# Patient Record
Sex: Female | Born: 1975 | Race: White | Hispanic: No | Marital: Married | Smoking: Former smoker
Health system: Southern US, Community
[De-identification: ages and names within clinical notes are randomized; demographics above are authoritative.]

## PROBLEM LIST (undated history)

## (undated) DIAGNOSIS — R569 Unspecified convulsions: Secondary | ICD-10-CM

## (undated) DIAGNOSIS — I10 Essential (primary) hypertension: Secondary | ICD-10-CM

## (undated) DIAGNOSIS — E669 Obesity, unspecified: Secondary | ICD-10-CM

## (undated) DIAGNOSIS — F5104 Psychophysiologic insomnia: Secondary | ICD-10-CM

## (undated) DIAGNOSIS — R0681 Apnea, not elsewhere classified: Secondary | ICD-10-CM

## (undated) DIAGNOSIS — G40309 Generalized idiopathic epilepsy and epileptic syndromes, not intractable, without status epilepticus: Secondary | ICD-10-CM

## (undated) DIAGNOSIS — G4733 Obstructive sleep apnea (adult) (pediatric): Secondary | ICD-10-CM

## (undated) DIAGNOSIS — F419 Anxiety disorder, unspecified: Secondary | ICD-10-CM

## (undated) HISTORY — DX: Obstructive sleep apnea (adult) (pediatric): G47.33

## (undated) HISTORY — DX: Essential (primary) hypertension: I10

## (undated) HISTORY — DX: Generalized idiopathic epilepsy and epileptic syndromes, not intractable, without status epilepticus: G40.309

## (undated) HISTORY — DX: Psychophysiologic insomnia: F51.04

## (undated) HISTORY — DX: Unspecified convulsions: R56.9

## (undated) HISTORY — DX: Apnea, not elsewhere classified: R06.81

## (undated) HISTORY — PX: OVARIAN CYST REMOVAL: SHX89

## (undated) HISTORY — DX: Obesity, unspecified: E66.9

## (undated) HISTORY — DX: Anxiety disorder, unspecified: F41.9

## (undated) HISTORY — PX: OTHER SURGICAL HISTORY: SHX169

---

## 2003-08-06 ENCOUNTER — Other Ambulatory Visit: Admission: RE | Admit: 2003-08-06 | Discharge: 2003-08-06 | Payer: Self-pay | Admitting: Obstetrics and Gynecology

## 2003-08-20 ENCOUNTER — Encounter: Admission: RE | Admit: 2003-08-20 | Discharge: 2003-08-20 | Payer: Self-pay | Admitting: Obstetrics and Gynecology

## 2004-08-24 ENCOUNTER — Encounter: Admission: RE | Admit: 2004-08-24 | Discharge: 2004-08-24 | Payer: Self-pay | Admitting: Obstetrics and Gynecology

## 2004-09-02 ENCOUNTER — Other Ambulatory Visit: Admission: RE | Admit: 2004-09-02 | Discharge: 2004-09-02 | Payer: Self-pay | Admitting: Obstetrics and Gynecology

## 2005-11-22 ENCOUNTER — Encounter: Admission: RE | Admit: 2005-11-22 | Discharge: 2005-11-22 | Payer: Self-pay | Admitting: Obstetrics and Gynecology

## 2009-12-18 ENCOUNTER — Emergency Department (HOSPITAL_COMMUNITY): Admission: EM | Admit: 2009-12-18 | Discharge: 2009-12-19 | Payer: Self-pay | Admitting: Emergency Medicine

## 2010-06-13 ENCOUNTER — Encounter: Payer: Self-pay | Admitting: Obstetrics and Gynecology

## 2010-08-08 LAB — URINE CULTURE
Colony Count: NO GROWTH
Culture: NO GROWTH

## 2010-08-08 LAB — URINE MICROSCOPIC-ADD ON

## 2010-08-08 LAB — COMPREHENSIVE METABOLIC PANEL
Alkaline Phosphatase: 103 U/L (ref 39–117)
CO2: 17 mEq/L — ABNORMAL LOW (ref 19–32)
Calcium: 9.4 mg/dL (ref 8.4–10.5)
Creatinine, Ser: 1.01 mg/dL (ref 0.4–1.2)
GFR calc Af Amer: >60 mL/min (ref >60)
GFR calc non Af Amer: >60 mL/min (ref >60)
Sodium: 134 mEq/L — ABNORMAL LOW (ref 135–145)

## 2010-08-08 LAB — RAPID URINE DRUG SCREEN, HOSP PERFORMED
Opiates: NOT DETECTED
Tetrahydrocannabinol: NOT DETECTED

## 2010-08-08 LAB — DIFFERENTIAL
Basophils Absolute: 0.2 10*3/uL — ABNORMAL HIGH (ref 0.0–0.1)
Basophils Relative: 1 % (ref 0–1)
Eosinophils Relative: 0 % (ref 0–5)
Lymphs Abs: 3.8 10*3/uL (ref 0.7–4.0)
Monocytes Relative: 6 % (ref 3–12)
Neutrophils Relative %: 69 % (ref 43–77)

## 2010-08-08 LAB — POCT PREGNANCY, URINE: Preg Test, Ur: NEGATIVE

## 2010-08-08 LAB — URINALYSIS, ROUTINE W REFLEX MICROSCOPIC
Hgb urine dipstick: NEGATIVE
Ketones, ur: 15 mg/dL — AB
Nitrite: NEGATIVE
Protein, ur: 100 mg/dL — AB
pH: 5.5 (ref 5.0–8.0)

## 2010-08-08 LAB — ETHANOL: Alcohol, Ethyl (B): <5 mg/dL (ref 0–10)

## 2010-08-08 LAB — CBC
HCT: 39.6 % (ref 36.0–46.0)
MCV: 87.8 fL (ref 78.0–100.0)
WBC: 16.3 10*3/uL — ABNORMAL HIGH (ref 4.0–10.5)
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
## 2012-10-18 ENCOUNTER — Other Ambulatory Visit: Payer: Self-pay | Admitting: Neurology

## 2012-10-18 ENCOUNTER — Other Ambulatory Visit: Payer: Self-pay

## 2012-10-18 DIAGNOSIS — R569 Unspecified convulsions: Secondary | ICD-10-CM

## 2012-10-18 NOTE — Telephone Encounter (Signed)
Pt calling for refill on keppra 500mg  po bid.  # 60 with 3 refills.  I called and spoke to Oak[place], with [facility] and ok'd the refill. I spoke to pt and relayed that she needs to give [place] 3 days or refills.

## 2013-02-17 ENCOUNTER — Other Ambulatory Visit: Payer: Self-pay | Admitting: Neurology

## 2013-05-14 ENCOUNTER — Other Ambulatory Visit: Payer: Self-pay | Admitting: Neurology

## 2013-05-18 ENCOUNTER — Telehealth: Payer: Self-pay | Admitting: Neurology

## 2013-05-18 NOTE — Telephone Encounter (Signed)
Attempted to return patient's call. There was no answer and no VM to leave message.

## 2013-05-18 NOTE — Telephone Encounter (Signed)
Patient called wanting to schedule an appointment with Dr. Anne Hahn for a follow up since she hasn't been seen in awhile. 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 Please call the patient to schedule.

## 2013-05-21 ENCOUNTER — Telehealth: Payer: Self-pay | Admitting: *Deleted

## 2013-05-21 NOTE — Telephone Encounter (Signed)
Left message in regards to scheduling appt after 4:30. Dr. Anne Hahn' last appt slot 4:00 patient can call back to schedule.

## 2013-06-14 ENCOUNTER — Other Ambulatory Visit: Payer: Self-pay | Admitting: Neurology

## 2013-06-19 ENCOUNTER — Telehealth: Payer: Self-pay | Admitting: *Deleted

## 2013-06-19 NOTE — Telephone Encounter (Addendum)
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 This has been more noticeable the last month.    Wakes at night (2 x per night).  She had sleep consult and split study, then saw Dr. Vickey Hugerohmeier back and no f/u thereafter.  She was speaking very loudly about this (regarding f/u).  She is wondering if medications she is on may be causing this. I told her that she has been on these seizure meds for a long time.  Her med list (includes prilosec, folic, Bp med ?name, upset stomache/GI med, MVI, keppra, and lamictal.  She never had CPAP.  Her appt that was scheduled then canceled per pt back in 2/14 (pt stated not correct).  She has f/u with you 6/15 for seizures.  I think this more sleep related.  Please advise.   [phone] cell.  [place]

## 2013-06-19 NOTE — Telephone Encounter (Signed)
Received msg from Taylor Collier to contact the Taylor Collier regarding a follow up sleep study.  Pt is very disgruntled.  According to her she has made several attempts to call the office to follow up regarding a titration study.  Taylor Collier says that she was hung up on and left messages that were never returned.  She says that she was not aware that she was scheduled for a cpap titration study last year.  I advised the Taylor Collier that she herself made this appointment after she had her follow up appt with Dr. Vickey Hugerohmeier to review the results from the first sleep study.  I will send this notification to [facility] to ask for advisement regarding whether the Taylor Collier can go ahead and proceed with a titration study or will she need another appt with Dr. Vickey Hugerohmeier since it has been a year since her last visit.

## 2013-06-19 NOTE — Telephone Encounter (Signed)
Events noted. The patient was last seen on the on 02/11/2012. She will need a revisit with me as well. May see NP.

## 2013-06-20 ENCOUNTER — Telehealth: Payer: Self-pay | Admitting: *Deleted

## 2013-06-20 NOTE — Telephone Encounter (Signed)
Left msg for patient to return call to set up revisit with NP. She has 11/15/13 appt with Dr. Anne HahnWillis.

## 2013-06-20 NOTE — Telephone Encounter (Signed)
Spoke with Dr. Vickey Hugerohmeier about this patient.   If her insurance will allow her to return for titration with no office visit, she is fine having her come in to do the study.  We will have to check with her insurance.  There is no insurance coverage in Epic currently, her coverage during the time of her last sleep study was [place] BCBS with a secondary BCBS plan as well.  If she still has An[employer], to my knowledge she should be fine to proceed as long as we can produce the proper documentation (which we can from [employer]).  If it is Medicare/UHC/Aetna/etc., she will likely have to have a new face to face visit in order to get CPAP therapy covered.  Dr. Vickey Hugerohmeier is curious which number she called so many times because we have received no calls or messages from her in the sleep lab, perhaps she was calling a different lab?

## 2013-06-22 ENCOUNTER — Telehealth: Payer: Self-pay | Admitting: Neurology

## 2013-06-22 ENCOUNTER — Encounter: Payer: Self-pay | Admitting: *Deleted

## 2013-06-22 ENCOUNTER — Other Ambulatory Visit: Payer: Self-pay | Admitting: *Deleted

## 2013-06-22 DIAGNOSIS — G4733 Obstructive sleep apnea (adult) (pediatric): Secondary | ICD-10-CM

## 2013-06-22 NOTE — Telephone Encounter (Signed)
I called the patient to discuss her coverage information for the titration study.  Told patient that her primary insurance [employer] BCBS did not approve [phone] [id number], however she has secondary insurance [place] which typically would cover the coinsurance remaining from the primary at 100%.  BUT because Anthem denied the sleep study I could not guarantee her that it would automatically be covered by secondary insurance.  Pt began to yell and scream that she would not be paying us a $1000 and that we needed to find that information out.  Explained to her that I spoke with our billing department yesterday and it is impossible to determine whether they would pay or not until the claims have been processed.  [employer] BCBS recommends APAP.  Told patient I would send a notification to the physician about this order request.

## 2013-06-22 NOTE — Telephone Encounter (Signed)
Orders only encounter entered for this patient to start APAP 4-14 cm H2O.  [facility] -sh   She will need to schedule with Dr. Vickey Hugerohmeier within 30 days of using CPAP therapy.

## 2013-07-16 NOTE — Telephone Encounter (Signed)
Anne Sweetser [facility] Y[place] Deak[place], RPSGT            FYI we have not been able to reach patient for cpap setup. Left one message and no answer the last two times. No contact letter has been sent      Copied from Epic message to [facility] Cobie [facility]coux from [facility].

## 2013-09-07 ENCOUNTER — Telehealth: Payer: Self-pay | Admitting: *Deleted

## 2013-09-07 NOTE — Telephone Encounter (Signed)
Patient called and says to disregard message sent earlier today--DMV has given her an extension so she will keep her appt in June--thank you.

## 2013-10-15 ENCOUNTER — Other Ambulatory Visit: Payer: Self-pay | Admitting: Neurology

## 2013-10-16 ENCOUNTER — Telehealth: Payer: Self-pay | Admitting: *Deleted

## 2013-10-16 NOTE — Telephone Encounter (Signed)
Rx was last prescribed in 2013.  Would you like to refill?  Patient has an appt scheduled in June

## 2013-10-16 NOTE — Telephone Encounter (Signed)
We have not prescribed this medication since 2013.  Request was sent to provider, however, they did decline to fill it at this time.  I called the patient back on cell, got no answer.  Called home, she was not there.  Gentleman that answered the phone will let her know we called.

## 2013-11-16 ENCOUNTER — Ambulatory Visit (INDEPENDENT_AMBULATORY_CARE_PROVIDER_SITE_OTHER): Payer: BC Managed Care – PPO | Admitting: Neurology

## 2013-11-16 ENCOUNTER — Encounter (INDEPENDENT_AMBULATORY_CARE_PROVIDER_SITE_OTHER): Payer: Self-pay

## 2013-11-16 ENCOUNTER — Encounter: Payer: Self-pay | Admitting: Neurology

## 2013-11-16 VITALS — BP 135/96 | HR 83 | Wt 265.0 lb

## 2013-11-16 DIAGNOSIS — G40309 Generalized idiopathic epilepsy and epileptic syndromes, not intractable, without status epilepticus: Secondary | ICD-10-CM

## 2013-11-16 DIAGNOSIS — Z5181 Encounter for therapeutic drug level monitoring: Secondary | ICD-10-CM

## 2013-11-16 HISTORY — DX: Generalized idiopathic epilepsy and epileptic syndromes, not intractable, without status epilepticus: G40.309

## 2013-11-16 MED ORDER — LEVETIRACETAM 500 MG PO TABS: 500.0000 mg | ORAL_TABLET | Freq: Two times a day (BID) | ORAL | Status: DC

## 2013-11-16 MED ORDER — LAMOTRIGINE 200 MG PO TABS: 200.0000 mg | ORAL_TABLET | Freq: Two times a day (BID) | ORAL | Status: DC

## 2013-11-16 NOTE — Patient Instructions (Addendum)
Take melatonin 5 mg at night  Epilepsy Epilepsy is a disorder in which a person has repeated seizures over time. A seizure is a release of abnormal electrical activity in the brain. Seizures can cause a change in attention, behavior, or the ability to remain awake and alert (altered mental status). Seizures often involve uncontrollable shaking (convulsions).  Most people with epilepsy lead normal lives. However, people with epilepsy are at an increased risk of falls, accidents, and injuries. Therefore, it is important to begin treatment right away. CAUSES  Epilepsy has many possible causes. Anything that disturbs the normal pattern of brain cell activity can lead to seizures. This may include:   Head injury.  Birth trauma.  High fever as a child.  Stroke.  Bleeding into or around the brain.  Certain drugs.  Prolonged low oxygen, such as what occurs after CPR efforts.  Abnormal brain development.  Certain illnesses, such as meningitis, encephalitis (brain infection), malaria, and other infections.  An imbalance of nerve signaling chemicals (neurotransmitters).  SIGNS AND SYMPTOMS  The symptoms of a seizure can vary greatly from one person to another. Right before a seizure, you may have a warning (aura) that a seizure is about to occur. An aura may include the following symptoms:  Fear or anxiety.  Nausea.  Feeling like the room is spinning (vertigo).  Vision changes, such as seeing flashing lights or spots. Common symptoms during a seizure include:  Abnormal sensations, such as an abnormal smell or a bitter taste in the mouth.   Sudden, general body stiffness.   Convulsions that involve rhythmic jerking of the face, arm, or leg on one or both sides.   Sudden change in consciousness.   Appearing to be awake but not responding.   Appearing to be asleep but cannot be awakened.   Grimacing, chewing, lip smacking, drooling, tongue biting, or loss of bowel or  bladder control. After a seizure, you may feel sleepy for a while. DIAGNOSIS  Your health care provider will ask about your symptoms and take a medical history. Descriptions from any witnesses to your seizures will be very helpful in the diagnosis. A physical exam, including a detailed neurological exam, is necessary. Various tests may be done, such as:   An electroencephalogram (EEG). This is a painless test of your brain waves. In this test, a diagram is created of your brain waves. These diagrams can be interpreted by a specialist.  An MRI of the brain.   A CT scan of the brain.   A spinal tap (lumbar puncture, LP).  Blood tests to check for signs of infection or abnormal blood chemistry. TREATMENT  There is no cure for epilepsy, but it is generally treatable. Once epilepsy is diagnosed, it is important to begin treatment as soon as possible. For most people with epilepsy, seizures can be controlled with medicines. The following may also be used:  A pacemaker for the brain (vagus nerve stimulator) can be used for people with seizures that are not well controlled by medicine.  Surgery on the brain. For some people, epilepsy eventually goes away. HOME CARE INSTRUCTIONS   Follow your health care provider's recommendations on driving and safety in normal activities.  Get enough rest. Lack of sleep can cause seizures.  Only take over-the-counter or prescription medicines as directed by your health care provider. Take any prescribed medicine exactly as directed.  Avoid any known triggers of your seizures.  Keep a seizure diary. Record what you recall about any seizure,  especially any possible trigger.   Make sure the people you live and work with know that you are prone to seizures. They should receive instructions on how to help you. In general, a witness to a seizure should:   Cushion your head and body.   Turn you on your side.   Avoid unnecessarily restraining you.    Not place anything inside your mouth.   Call for emergency medical help if there is any question about what has occurred.   Follow up with your health care provider as directed. You may need regular blood tests to monitor the levels of your medicine.  SEEK MEDICAL CARE IF:   You develop signs of infection or other illness. This might increase the risk of a seizure.   You seem to be having more frequent seizures.   Your seizure pattern is changing.  SEEK IMMEDIATE MEDICAL CARE IF:   You have a seizure that does not stop after a few moments.   You have a seizure that causes any difficulty in breathing.   You have a seizure that results in a very severe headache.   You have a seizure that leaves you with the inability to speak or use a part of your body.  Document Released: 05/10/2005 Document Revised: 02/28/2013 Document Reviewed: 12/20/2012 [facility] Patient Information 2015 [employer] [place]. This information is not intended to replace advice given to you by your health care provider. Make sure you discuss any questions you have with your health care provider.
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
## 2013-11-19 DIAGNOSIS — Z0289 Encounter for other administrative examinations: Secondary | ICD-10-CM

## 2013-11-19 LAB — CBC WITH DIFFERENTIAL
BASOS ABS: 0 10*3/uL (ref 0.0–0.2)
Basos: 0 %
EOS: 0 %
Eosinophils Absolute: 0 10*3/uL (ref 0.0–0.4)
HEMATOCRIT: 33.9 % — AB (ref 34.0–46.6)
Hemoglobin: 11.7 g/dL (ref 11.1–15.9)
Immature Grans (Abs): 0 10*3/uL (ref 0.0–0.1)
Immature Granulocytes: 0 %
LYMPHS: 26 %
Lymphocytes Absolute: 2.1 10*3/uL (ref 0.7–3.1)
MCH: 27.5 pg (ref 26.6–33.0)
MCHC: 34.5 g/dL (ref 31.5–35.7)
MCV: 80 fL (ref 79–97)
Monocytes Absolute: 0.5 10*3/uL (ref 0.1–0.9)
Monocytes: 7 %
NEUTROS ABS: 5.2 10*3/uL (ref 1.4–7.0)
Neutrophils Relative %: 67 %
PLATELETS: 336 10*3/uL (ref 150–379)
RBC: 4.25 x10E6/uL (ref 3.77–5.28)
RDW: 14.7 % (ref 12.3–15.4)
WBC: 7.8 10*3/uL (ref 3.4–10.8)

## 2013-11-19 LAB — COMPREHENSIVE METABOLIC PANEL
A/G RATIO: 2.2 (ref 1.1–2.5)
ALBUMIN: 4.3 g/dL (ref 3.5–5.5)
ALT: 26 IU/L (ref 0–32)
AST: 21 IU/L (ref 0–40)
Alkaline Phosphatase: 94 IU/L (ref 39–117)
BUN/Creatinine Ratio: 15 (ref 9–23)
BUN: 11 mg/dL (ref 6–24)
CO2: 21 mmol/L (ref 18–29)
Calcium: 9.4 mg/dL (ref 8.7–10.2)
Chloride: 100 mmol/L (ref 97–108)
Creatinine, Ser: 0.72 mg/dL (ref 0.57–1.00)
GFR, EST AFRICAN AMERICAN: 121 mL/min/{1.73_m2} (ref >59)
GFR, EST NON AFRICAN AMERICAN: 105 mL/min/{1.73_m2} (ref >59)
GLOBULIN, TOTAL: 2 g/dL (ref 1.5–4.5)
GLUCOSE: 80 mg/dL (ref 65–99)
Potassium: 4.4 mmol/L (ref 3.5–5.2)
SODIUM: 139 mmol/L (ref 134–144)
TOTAL PROTEIN: 6.3 g/dL (ref 6.0–8.5)
Total Bilirubin: 0.3 mg/dL (ref 0.0–1.2)

## 2013-11-19 LAB — LAMOTRIGINE LEVEL: LAMOTRIGINE LVL: 6.6 ug/mL (ref 2.0–20.0)

## 2013-11-19 NOTE — Progress Notes (Signed)
Quick Note:  Shared unremarkable results with patient, she verbalized understanding ______ 

## 2013-12-14 ENCOUNTER — Other Ambulatory Visit: Payer: Self-pay | Admitting: Neurology

## 2013-12-26 NOTE — Telephone Encounter (Signed)
Noted  

## 2014-06-03 ENCOUNTER — Other Ambulatory Visit: Payer: Self-pay | Admitting: Neurology

## 2014-11-19 ENCOUNTER — Ambulatory Visit: Payer: BC Managed Care – PPO | Admitting: Neurology

## 2014-12-02 ENCOUNTER — Other Ambulatory Visit: Payer: Self-pay | Admitting: Neurology

## 2014-12-02 NOTE — Telephone Encounter (Signed)
Has appt scheduled.

## 2014-12-24 ENCOUNTER — Encounter: Payer: Self-pay | Admitting: Neurology

## 2014-12-24 ENCOUNTER — Ambulatory Visit (INDEPENDENT_AMBULATORY_CARE_PROVIDER_SITE_OTHER): Payer: BC Managed Care – PPO | Admitting: Neurology

## 2014-12-24 VITALS — BP 157/93 | HR 85 | Ht 64.0 in | Wt 269.4 lb

## 2014-12-24 DIAGNOSIS — F5104 Psychophysiologic insomnia: Secondary | ICD-10-CM | POA: Insufficient documentation

## 2014-12-24 DIAGNOSIS — G40309 Generalized idiopathic epilepsy and epileptic syndromes, not intractable, without status epilepticus: Secondary | ICD-10-CM

## 2014-12-24 DIAGNOSIS — Z5181 Encounter for therapeutic drug level monitoring: Secondary | ICD-10-CM | POA: Diagnosis not present

## 2014-12-24 DIAGNOSIS — G4700 Insomnia, unspecified: Secondary | ICD-10-CM | POA: Diagnosis not present

## 2014-12-24 HISTORY — DX: Psychophysiologic insomnia: F51.04

## 2014-12-24 MED ORDER — LAMOTRIGINE 200 MG PO TABS: 200.0000 mg | ORAL_TABLET | Freq: Two times a day (BID) | ORAL | Status: DC

## 2014-12-24 MED ORDER — TRAZODONE HCL 50 MG PO TABS: 50.0000 mg | ORAL_TABLET | Freq: Every day | ORAL | Status: DC

## 2014-12-24 MED ORDER — LEVETIRACETAM 500 MG PO TABS: 500.0000 mg | ORAL_TABLET | Freq: Two times a day (BID) | ORAL | Status: DC

## 2014-12-24 NOTE — Progress Notes (Signed)
Reason for visit: Seizures  Taylor Collier is an 39 y.o. female  History of present illness:  Taylor Collier is a 39 year old right-handed white female with a history of obesity and a history of seizures. She also reports chronic issues with insomnia. She will be sleepy during the day, and she may take a 2 or 3 hour nap. In the evening, she has racing thoughts, and she is unable to sleep. She denies any seizures since last seen, the last seizure was in January 2013. The patient does operate a motor vehicle, and she is doing well with this. She works as a Runner, broadcasting/film/video. The patient is on Keppra and Lamictal, she is concerned that the Keppra is making her drowsy during the daytime. She has taken 10 mg of melatonin at night without benefit. She returns to this office for an evaluation.  Past Medical History  Diagnosis Date  . Seizures   . Hypertension   . Anxiety   . Apnea     witnessed with snoring  . OSA (obstructive sleep apnea)   . 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 Obesity   . Chronic insomnia 12/24/2014    Past Surgical History  Procedure Laterality Date  . Fallopian tube blockage resection    . Ovarian cyst removal    . Birthmark resection      Family History  Problem Relation Age of Onset  . Colon cancer Mother   . Aneurysm Father   . Breast cancer Sister   . Diabetes Paternal Aunt   . Alzheimer's disease Maternal Grandmother     Social history:  reports that she has quit smoking. Her smoking use included Cigarettes. She has never used smokeless tobacco. She reports that she does not drink alcohol or use illicit drugs.    Allergies  Allergen Reactions  . Penicillins Rash    Medications:  Prior to Admission medications   Medication Sig Start Date End Date Taking? Authorizing Provider  acetaminophen (TYLENOL) 500 MG tablet Take 1,000 mg by mouth daily.   Yes Historical Provider, MD  cetirizine (ZYRTEC) 10 MG tablet Take 10  mg by mouth daily.   Yes Historical Provider, MD  folic acid (FOLVITE) 1 MG tablet TAKE 1 TABLET BY MOUTH TWICE DAILY 12/14/13  Yes York Spaniel, MD  hyoscyamine (LEVSIN, ANASPAZ) 0.125 MG tablet Take 0.125 mg by mouth 2 (two) times daily.  11/13/13  Yes Historical Provider, MD  lamoTRIgine (LAMICTAL) 200 MG tablet TAKE 1 TABLET BY MOUTH TWICE DAILY 12/02/14  Yes York Spaniel, MD  levETIRAcetam (KEPPRA) 500 MG tablet TAKE 1 TABLET BY MOUTH TWICE DAILY 12/02/14  Yes York Spaniel, MD  Melatonin 10 MG TABS Take 1 tablet by mouth at bedtime.   Yes Historical Provider, MD  methyldopa (ALDOMET) 500 MG tablet Take 500 mg by mouth 3 (three) times daily.  11/13/13  Yes Historical Provider, MD  Multiple Vitamin (MULTIVITAMIN) tablet Take 1 tablet by mouth daily.   Yes Historical Provider, MD  omeprazole (PRILOSEC) 20 MG capsule Take 20 mg by mouth daily. 11/12/13  Yes Historical Provider, MD    ROS:  Out of a complete 14 system review of symptoms, the patient complains only of the following symptoms, and all other reviewed systems are negative.  Excessive thirst Daytime sleepiness, snoring Headache   Blood pressure 157/93, pulse 85, height  (1.626 m), weight 269 lb 6.4 oz (122.199 kg).  Physical Exam  General: The patient  is alert and cooperative at the time of the examination. The patient is markedly obese.  Skin: No significant peripheral edema is noted.   Neurologic Exam  Mental status: The patient is alert and oriented x 3 at the time of the examination. The patient has apparent normal recent and remote memory, with an apparently normal attention span and concentration ability.   Cranial nerves: Facial symmetry is present. Speech is normal, no aphasia or dysarthria is noted. Extraocular movements are full. Visual fields are full.  Motor: The patient has good strength in all 4 extremities.  Sensory examination: Soft touch sensation is symmetric on the face, arms, and  legs.  Coordination: The patient has good finger-nose-finger and heel-to-shin bilaterally.  Gait and station: The patient has a normal gait. Tandem gait is normal. Romberg is negative. No drift is seen.  Reflexes: Deep tendon reflexes are symmetric.   Assessment/Plan:  1. History of seizures, well controlled  2. Chronic insomnia  3. Obesity  4. Obstructive sleep apnea  The patient will continue her Lamictal and Keppra, and the future we may consider switching to the extended release Keppra. The patient will have blood work done today. She will be given a trial on trazodone for the insomnia to see if this is helpful. She will follow-up in one year, sooner if needed.  Marlan Palau MD 12/24/2014 7:28 PM  [facility] [address] [place], [place] [address]  Phone [phone] Fax [phone]

## 2014-12-24 NOTE — Patient Instructions (Addendum)
   We will continue Keppra and Lamictal for now, if you desire to have a long-acting preparation of Keppra, but me know. We will try trazodone at night for sleep, you may start at a 50 mg dose, but if this is not enough, you may go to 100 mg or 150 mg at night. We will follow-up in one year, or sooner if needed.   Seizure, Adult A seizure is abnormal electrical activity in the brain. Seizures usually last from 30 seconds to 2 minutes. There are various types of seizures. Before a seizure, you may have a warning sensation (aura) that a seizure is about to occur. An aura may include the following symptoms:   Fear or anxiety.  Nausea.  Feeling like the room is spinning (vertigo).  Vision changes, such as seeing flashing lights or spots. Common symptoms during a seizure include:  A change in attention or behavior (altered mental status).  Convulsions with rhythmic jerking movements.  Drooling.  Rapid eye movements.  Grunting.  Loss of bladder and bowel control.  Bitter taste in the mouth.  Tongue biting. After a seizure, you may feel confused and sleepy. You may also have an injury resulting from convulsions during the seizure. HOME CARE INSTRUCTIONS   If you are given medicines, take them exactly as prescribed by your health care provider.  Keep all follow-up appointments as directed by your health care provider.  Do not swim or drive or engage in risky activity during which a seizure could cause further injury to you or others until your health care provider says it is OK.  Get adequate rest.  Teach friends and family what to do if you have a seizure. They should:  Lay you on the ground to prevent a fall.  Put a cushion under your head.  Loosen any tight clothing around your neck.  Turn you on your side. If vomiting occurs, this helps keep your airway clear.  Stay with you until you recover.  Know whether or not you need emergency care. SEEK IMMEDIATE MEDICAL  CARE IF:  The seizure lasts longer than 5 minutes.  The seizure is severe or you do not wake up immediately after the seizure.  You have an altered mental status after the seizure.  You are having more frequent or worsening seizures. Someone should drive you to the emergency department or call local emergency services (911 in [place] MAKE SURE YOU:  Understand these instructions.  Will watch your condition.  Will get help right away if you are not doing well or get worse. Document Released: 05/07/2000 Document Revised: 02/28/2013 Document Reviewed: 12/20/2012 [facility] Patient Information 2015 [place], [place]. This information is not intended to replace advice given to you by your health care provider. Make sure you discuss any questions you have with your health care provider.

## 2014-12-25 LAB — CBC WITH DIFFERENTIAL/PLATELET
BASOS ABS: 0 10*3/uL (ref 0.0–0.2)
Basos: 0 %
EOS (ABSOLUTE): 0 10*3/uL (ref 0.0–0.4)
EOS: 0 %
HEMOGLOBIN: 11.1 g/dL (ref 11.1–15.9)
Hematocrit: 33.9 % — ABNORMAL LOW (ref 34.0–46.6)
Immature Grans (Abs): 0 10*3/uL (ref 0.0–0.1)
Immature Granulocytes: 0 %
Lymphocytes Absolute: 2.1 10*3/uL (ref 0.7–3.1)
Lymphs: 30 %
MCH: 26.7 pg (ref 26.6–33.0)
MCHC: 32.7 g/dL (ref 31.5–35.7)
MCV: 82 fL (ref 79–97)
MONOS ABS: 0.5 10*3/uL (ref 0.1–0.9)
Monocytes: 7 %
Neutrophils Absolute: 4.3 10*3/uL (ref 1.4–7.0)
Neutrophils: 63 %
Platelets: 353 10*3/uL (ref 150–379)
RBC: 4.15 x10E6/uL (ref 3.77–5.28)
RDW: 16.4 % — AB (ref 12.3–15.4)
WBC: 6.8 10*3/uL (ref 3.4–10.8)

## 2014-12-25 LAB — COMPREHENSIVE METABOLIC PANEL
A/G RATIO: 1.9 (ref 1.1–2.5)
ALK PHOS: 96 IU/L (ref 39–117)
ALT: 27 IU/L (ref 0–32)
AST: 23 IU/L (ref 0–40)
Albumin: 4.5 g/dL (ref 3.5–5.5)
BUN/Creatinine Ratio: 14 (ref 9–23)
BUN: 9 mg/dL (ref 6–24)
Bilirubin Total: 0.4 mg/dL (ref 0.0–1.2)
CO2: 21 mmol/L (ref 18–29)
CREATININE: 0.64 mg/dL (ref 0.57–1.00)
Calcium: 9.4 mg/dL (ref 8.7–10.2)
Chloride: 99 mmol/L (ref 97–108)
GFR calc Af Amer: 128 mL/min/{1.73_m2} (ref >59)
GFR, EST NON AFRICAN AMERICAN: 111 mL/min/{1.73_m2} (ref >59)
GLOBULIN, TOTAL: 2.4 g/dL (ref 1.5–4.5)
GLUCOSE: 80 mg/dL (ref 65–99)
Potassium: 4.4 mmol/L (ref 3.5–5.2)
SODIUM: 138 mmol/L (ref 134–144)
Total Protein: 6.9 g/dL (ref 6.0–8.5)

## 2014-12-25 LAB — LAMOTRIGINE LEVEL: LAMOTRIGINE LVL: 8.3 ug/mL (ref 2.0–20.0)

## 2014-12-26 ENCOUNTER — Telehealth: Payer: Self-pay

## 2014-12-26 NOTE — Telephone Encounter (Signed)
-----   Message from York Spaniel, MD sent at 12/25/2014  6:20 PM EDT -----  The blood work results are unremarkable. Good lamotrigine level, no change in dosing. Please call the patient. ----- Message -----    From: [facility] Lab Results In Interface    Sent: 12/25/2014   7:43 AM      To: York Spaniel, MD

## 2014-12-26 NOTE — Telephone Encounter (Signed)
I called the patient and relayed results. 

## 2014-12-30 ENCOUNTER — Other Ambulatory Visit: Payer: Self-pay | Admitting: Neurology

## 2015-01-02 ENCOUNTER — Other Ambulatory Visit: Payer: Self-pay | Admitting: Neurology

## 2015-02-27 ENCOUNTER — Other Ambulatory Visit: Payer: Self-pay | Admitting: Neurology

## 2015-04-16 ENCOUNTER — Telehealth: Payer: Self-pay | Admitting: Neurology

## 2015-04-16 NOTE — Telephone Encounter (Signed)
Pt  Called to see if lab orders could be sent to get her levels for her meds could be checked. She said she sees him once a year but half way through the year she likes to get her levels checked but was hoping it could be sent to the [facility] in [place].  She can be reached at [phone].

## 2015-04-16 NOTE — Telephone Encounter (Signed)
I called the patient. She stated she would like to have her lamotrigine level checked. She would like to know if Dr. Anne HahnWillis will put in an order for this. She states that she gets dizzy at times and has a slight headache at times (not unbearable)... She just wants to make sure her levels are still okay. I advised that we would call back Monday as Dr. Anne HahnWillis is out of the office until then.

## 2015-04-16 NOTE — Telephone Encounter (Signed)
I called the patient. She wants blood levels checked, she is having drowsiness and headache. I will check keppra and lamotrigine levels. Will need to fax order to [facility].

## 2015-04-23 NOTE — Telephone Encounter (Signed)
Order re-written and faxed to number provided.

## 2015-04-23 NOTE — Telephone Encounter (Signed)
Patient called to advise she is currently at [facility] in [place] to have labs drawn but was advised order we faxed is not legible, order needs to be re-faxed to [phone].

## 2015-04-28 ENCOUNTER — Other Ambulatory Visit: Payer: Self-pay | Admitting: Neurology

## 2015-04-30 ENCOUNTER — Telehealth: Payer: Self-pay | Admitting: Neurology

## 2015-04-30 NOTE — Telephone Encounter (Signed)
Level of Keppra done at [facility] was 7.9 on November 30.

## 2015-05-27 ENCOUNTER — Encounter: Payer: Self-pay | Admitting: Neurology

## 2015-06-27 ENCOUNTER — Other Ambulatory Visit: Payer: Self-pay | Admitting: Neurology

## 2015-08-26 ENCOUNTER — Other Ambulatory Visit: Payer: Self-pay | Admitting: Neurology

## 2015-10-30 ENCOUNTER — Telehealth: Payer: Self-pay | Admitting: Neurology

## 2015-10-30 NOTE — Telephone Encounter (Signed)
Medical Records faxed to [facility] (p) [phone]

## 2015-12-15 ENCOUNTER — Encounter: Payer: Self-pay | Admitting: Cardiology

## 2015-12-24 ENCOUNTER — Encounter: Payer: Self-pay | Admitting: Neurology

## 2015-12-24 ENCOUNTER — Ambulatory Visit (INDEPENDENT_AMBULATORY_CARE_PROVIDER_SITE_OTHER): Payer: BC Managed Care – PPO | Admitting: Neurology

## 2015-12-24 VITALS — BP 147/97 | HR 83 | Ht 64.0 in | Wt 271.0 lb

## 2015-12-24 DIAGNOSIS — G40309 Generalized idiopathic epilepsy and epileptic syndromes, not intractable, without status epilepticus: Secondary | ICD-10-CM

## 2015-12-24 DIAGNOSIS — F5104 Psychophysiologic insomnia: Secondary | ICD-10-CM

## 2015-12-24 DIAGNOSIS — Z5181 Encounter for therapeutic drug level monitoring: Secondary | ICD-10-CM | POA: Diagnosis not present

## 2015-12-24 DIAGNOSIS — G4700 Insomnia, unspecified: Secondary | ICD-10-CM

## 2015-12-24 MED ORDER — TRAZODONE HCL 50 MG PO TABS: 100.0000 mg | ORAL_TABLET | Freq: Every day | ORAL | 5 refills | Status: DC

## 2015-12-24 MED ORDER — LAMOTRIGINE 200 MG PO TABS: 200.0000 mg | ORAL_TABLET | Freq: Two times a day (BID) | ORAL | 3 refills | Status: DC

## 2015-12-24 MED ORDER — LEVETIRACETAM 500 MG PO TABS: 500.0000 mg | ORAL_TABLET | Freq: Two times a day (BID) | ORAL | 3 refills | Status: DC

## 2015-12-24 NOTE — Patient Instructions (Addendum)
Insomnia Insomnia is a sleep disorder that makes it difficult to fall asleep or to stay asleep. Insomnia can cause tiredness (fatigue), low energy, difficulty concentrating, mood swings, and poor performance at work or school.  There are three different ways to classify insomnia:  Difficulty falling asleep.  Difficulty staying asleep.  Waking up too early in the morning. Any type of insomnia can be long-term (chronic) or short-term (acute). Both are common. Short-term insomnia usually lasts for three months or less. Chronic insomnia occurs at least three times a week for longer than three months. CAUSES  Insomnia may be caused by another condition, situation, or substance, such as:  Anxiety.  Certain medicines.  Gastroesophageal reflux disease (GERD) or other gastrointestinal conditions.  Asthma or other breathing conditions.  Restless legs syndrome, sleep apnea, or other sleep disorders.  Chronic pain.  Menopause. This may include hot flashes.  Stroke.  Abuse of alcohol, tobacco, or illegal drugs.  Depression.  Caffeine.   Neurological disorders, such as Alzheimer disease.  An overactive thyroid (hyperthyroidism). The cause of insomnia may not be known. RISK FACTORS Risk factors for insomnia include:  Gender. Women are more commonly affected than men.  Age. Insomnia is more common as you get older.  Stress. This may involve your professional or personal life.  Income. Insomnia is more common in people with lower income.  Lack of exercise.   Irregular work schedule or night shifts.  Traveling between different time zones. SIGNS AND SYMPTOMS If you have insomnia, trouble falling asleep or trouble staying asleep is the main symptom. This may lead to other symptoms, such as:  Feeling fatigued.  Feeling nervous about going to sleep.  Not feeling rested in the morning.  Having trouble concentrating.  Feeling irritable, anxious, or depressed. TREATMENT   Treatment for insomnia depends on the cause. If your insomnia is caused by an underlying condition, treatment will focus on addressing the condition. Treatment may also include:   Medicines to help you sleep.  Counseling or therapy.  Lifestyle adjustments. HOME CARE INSTRUCTIONS   Take medicines only as directed by your health care provider.  Keep regular sleeping and waking hours. Avoid naps.  Keep a sleep diary to help you and your health care provider figure out what could be causing your insomnia. Include:   When you sleep.  When you wake up during the night.  How well you sleep.   How rested you feel the next day.  Any side effects of medicines you are taking.  What you eat and drink.   Make your bedroom a comfortable place where it is easy to fall asleep:  Put up shades or special blackout curtains to block light from outside.  Use a white noise machine to block noise.  Keep the temperature cool.   Exercise regularly as directed by your health care provider. Avoid exercising right before bedtime.  Use relaxation techniques to manage stress. Ask your health care provider to suggest some techniques that may work well for you. These may include:  Breathing exercises.  Routines to release muscle tension.  Visualizing peaceful scenes.  Cut back on alcohol, caffeinated beverages, and cigarettes, especially close to bedtime. These can disrupt your sleep.  Do not overeat or eat spicy foods right before bedtime. This can lead to digestive discomfort that can make it hard for you to sleep.  Limit screen use before bedtime. This includes:  Watching TV.  Using your smartphone, tablet, and computer.  Stick to a routine. This   can help you fall asleep faster. Try to do a quiet activity, brush your teeth, and go to bed at the same time each night.  Get out of bed if you are still awake after 15 minutes of trying to sleep. Keep the lights down, but try reading or  doing a quiet activity. When you feel sleepy, go back to bed.  Make sure that you drive carefully. Avoid driving if you feel very sleepy.  Keep all follow-up appointments as directed by your health care provider. This is important. SEEK MEDICAL CARE IF:   You are tired throughout the day or have trouble in your daily routine due to sleepiness.  You continue to have sleep problems or your sleep problems get worse. SEEK IMMEDIATE MEDICAL CARE IF:   You have serious thoughts about hurting yourself or someone else.   This information is not intended to replace advice given to you by your health care provider. Make sure you discuss any questions you have with your health care provider.   Document Released: 05/07/2000 Document Revised: 01/29/2015 Document Reviewed: 02/08/2014 Elsevier Interactive Patient Education 2016 [employer] Inc.  

## 2015-12-24 NOTE — Progress Notes (Signed)
Reason for visit: Seizures  Taylor Collier is an 40 y.o. female  History of present illness:  Taylor Collier is a 40 year old right-handed white female with a history of seizures that have been under good control. The last seizure was in January 2013. The patient works as a Runner, broadcasting/film/video, she operates a Librarian, academic. The patient has had chronic issues with insomnia. The patient has trouble getting to sleep, and she has excessive daytime drowsiness during the day. She is not very active physically during the day. She has had issues with hypertension, she has had some recent adjustments with her blood pressure medications. The patient is on Lamictal and Keppra, these medications have been well tolerated. She takes trazodone at night, but only 50 mg. She returns for an evaluation.  Past Medical History:  Diagnosis Date  . Anxiety   . Apnea    witnessed with snoring  . Chronic insomnia 12/24/2014  . 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 Hypertension   . Obesity   . OSA (obstructive sleep apnea)   . Seizures (HCC)     Past Surgical History:  Procedure Laterality Date  . birthmark resection    . fallopian tube blockage resection    . OVARIAN CYST REMOVAL      Family History  Problem Relation Age of Onset  . Colon cancer Mother   . Aneurysm Father   . Breast cancer Sister   . Diabetes Paternal Aunt   . Alzheimer's disease Maternal Grandmother     Social history:  reports that she has quit smoking. Her smoking use included Cigarettes. She has never used smokeless tobacco. She reports that she does not drink alcohol or use drugs.    Allergies  Allergen Reactions  . Amoxicillin-Pot Clavulanate   . Penicillins Rash    Medications:  Prior to Admission medications   Medication Sig Start Date End Date Taking? Authorizing Provider  acetaminophen (TYLENOL) 500 MG tablet Take 1,000 mg by mouth daily.   Yes Historical Provider, MD  cetirizine  (ZYRTEC) 10 MG tablet Take 10 mg by mouth daily.   Yes Historical Provider, MD  folic acid (FOLVITE) 1 MG tablet TAKE 1 TABLET BY MOUTH TWICE DAILY 12/31/14  Yes Taylor Spaniel, MD  hydrochlorothiazide (HYDRODIURIL) 25 MG tablet Take 25 mg by mouth.   Yes Historical Provider, MD  labetalol (NORMODYNE) 100 MG tablet Take 100 mg by mouth 2 (two) times daily. 11/10/15  Yes Historical Provider, MD  lamoTRIgine (LAMICTAL) 200 MG tablet Take 1 tablet (200 mg total) by mouth 2 (two) times daily. 12/24/15  Yes Taylor Spaniel, MD  levETIRAcetam (KEPPRA) 500 MG tablet Take 1 tablet (500 mg total) by mouth 2 (two) times daily. 12/24/15  Yes Taylor Spaniel, MD  Multiple Vitamin (MULTIVITAMIN) tablet Take 1 tablet by mouth daily.   Yes Historical Provider, MD  traZODone (DESYREL) 50 MG tablet Take 2 tablets (100 mg total) by mouth at bedtime. 12/24/15  Yes Taylor Spaniel, MD    ROS:  Out of a complete 14 system review of symptoms, the patient complains only of the following symptoms, and all other reviewed systems are negative.  Frequent waking, daytime sleepiness  Blood pressure (!) 147/97, pulse 83, height 5\' 4"  (1.626 m), weight 271 lb (122.9 kg).  Physical Exam  General: The patient is alert and cooperative at the time of the examination. The patient is markedly obese.  Skin: No significant peripheral edema is noted.  Neurologic Exam  Mental status: The patient is alert and oriented x 3 at the time of the examination. The patient has apparent normal recent and remote memory, with an apparently normal attention span and concentration ability.   Cranial nerves: Facial symmetry is present. Speech is normal, no aphasia or dysarthria is noted. Extraocular movements are full. Visual fields are full.  Motor: The patient has good strength in all 4 extremities.  Sensory examination: Soft touch sensation is symmetric on the face, arms, and legs.  Coordination: The patient has good finger-nose-finger  and heel-to-shin bilaterally.  Gait and station: The patient has a normal gait. Tandem gait is slightly unsteady. Romberg is negative. No drift is seen.  Reflexes: Deep tendon reflexes are symmetric.   Assessment/Plan:  1. History seizures, well controlled  2. Chronic insomnia  The patient will go up on the trazodone taking 100 mg at night, a prescription was called in. She will have blood work done today, a prescription for the Keppra and Lamictal were called in today. The patient will follow-up in one year, sooner if needed. The patient is to contact our office if the insomnia remains an issue. I urged her to increase her physical activity to improve her blood pressure and sleeping habits.   Marlan Palau MD 12/24/2015 9:33 AM  [facility] [address] [place], [place] [address]  Phone [phone] Fax [phone]

## 2015-12-26 LAB — COMPREHENSIVE METABOLIC PANEL
A/G RATIO: 1.7 (ref 1.2–2.2)
ALBUMIN: 4.3 g/dL (ref 3.5–5.5)
ALT: 43 IU/L — AB (ref 0–32)
AST: 29 IU/L (ref 0–40)
Alkaline Phosphatase: 101 IU/L (ref 39–117)
BUN/Creatinine Ratio: 13 (ref 9–23)
BUN: 11 mg/dL (ref 6–24)
Bilirubin Total: 0.5 mg/dL (ref 0.0–1.2)
CALCIUM: 9.7 mg/dL (ref 8.7–10.2)
CO2: 22 mmol/L (ref 18–29)
CREATININE: 0.82 mg/dL (ref 0.57–1.00)
Chloride: 99 mmol/L (ref 96–106)
GFR calc Af Amer: 102 mL/min/{1.73_m2} (ref >59)
GFR, EST NON AFRICAN AMERICAN: 89 mL/min/{1.73_m2} (ref >59)
Globulin, Total: 2.6 g/dL (ref 1.5–4.5)
Glucose: 104 mg/dL — ABNORMAL HIGH (ref 65–99)
POTASSIUM: 4 mmol/L (ref 3.5–5.2)
Sodium: 141 mmol/L (ref 134–144)
TOTAL PROTEIN: 6.9 g/dL (ref 6.0–8.5)

## 2015-12-26 LAB — LEVETIRACETAM LEVEL: Levetiracetam Lvl: 22.2 ug/mL (ref 10.0–40.0)

## 2015-12-26 LAB — CBC WITH DIFFERENTIAL/PLATELET
BASOS ABS: 0 10*3/uL (ref 0.0–0.2)
BASOS: 1 %
EOS (ABSOLUTE): 0.1 10*3/uL (ref 0.0–0.4)
Eos: 1 %
Hematocrit: 38.3 % (ref 34.0–46.6)
Hemoglobin: 12.6 g/dL (ref 11.1–15.9)
IMMATURE GRANS (ABS): 0 10*3/uL (ref 0.0–0.1)
IMMATURE GRANULOCYTES: 0 %
LYMPHS: 21 %
Lymphocytes Absolute: 1.8 10*3/uL (ref 0.7–3.1)
MCH: 28.7 pg (ref 26.6–33.0)
MCHC: 32.9 g/dL (ref 31.5–35.7)
MCV: 87 fL (ref 79–97)
Monocytes Absolute: 0.6 10*3/uL (ref 0.1–0.9)
Monocytes: 7 %
NEUTROS PCT: 70 %
Neutrophils Absolute: 6 10*3/uL (ref 1.4–7.0)
PLATELETS: 332 10*3/uL (ref 150–379)
RBC: 4.39 x10E6/uL (ref 3.77–5.28)
RDW: 14.5 % (ref 12.3–15.4)
WBC: 8.6 10*3/uL (ref 3.4–10.8)

## 2015-12-26 LAB — LAMOTRIGINE LEVEL: LAMOTRIGINE LVL: 4.9 ug/mL (ref 2.0–20.0)

## 2015-12-29 ENCOUNTER — Telehealth: Payer: Self-pay

## 2015-12-29 NOTE — Telephone Encounter (Signed)
Called pt w/ unremarkable lab results. Let her know that Keppra and Lamictal levels are w/in therapuetic range. Pt voiced concern re: Lamictal level being at the low end of normal. Says that she had a seizure when that happened previously and had to add Keppra. Explained that addition of Keppra was to help w/ prevention of  breakthrough seizures. Verbalized understanding and appreciation for call.

## 2015-12-29 NOTE — Telephone Encounter (Signed)
-----   Message from York Spanielharles K Willis, MD sent at 12/26/2015  1:14 PM EDT ----- The blood work results are unremarkable.  Keppra and Lamictal levels are therapeutic. No change in dosing. Please call the patient. ----- Message ----- From: Nell RangeInterface, [facility] Lab Results In Sent: 12/25/2015   7:42 AM To: York Spanielharles K Willis, MD

## 2016-01-09 ENCOUNTER — Encounter: Payer: Self-pay | Admitting: *Deleted

## 2016-01-12 ENCOUNTER — Ambulatory Visit (INDEPENDENT_AMBULATORY_CARE_PROVIDER_SITE_OTHER): Payer: BC Managed Care – PPO | Admitting: Cardiology

## 2016-01-12 ENCOUNTER — Encounter: Payer: Self-pay | Admitting: Cardiology

## 2016-01-12 VITALS — BP 121/89 | HR 81 | Ht 64.0 in | Wt 276.0 lb

## 2016-01-12 DIAGNOSIS — R011 Cardiac murmur, unspecified: Secondary | ICD-10-CM | POA: Diagnosis not present

## 2016-01-12 DIAGNOSIS — I10 Essential (primary) hypertension: Secondary | ICD-10-CM

## 2016-01-12 MED ORDER — CHLORTHALIDONE 25 MG PO TABS: 25.0000 mg | ORAL_TABLET | Freq: Every day | ORAL | 6 refills | Status: DC

## 2016-01-12 MED ORDER — LABETALOL HCL 200 MG PO TABS: 200.0000 mg | ORAL_TABLET | Freq: Two times a day (BID) | ORAL | 6 refills | Status: DC

## 2016-01-12 NOTE — Progress Notes (Addendum)
Clinical Summary [name] is a 40 y.o.female seen today as a new patient, she is referred by Dr Argie RammingBurdnine.    1. HTN - notes high bp's, 220s/140s. Se[facility] in early June with high bp and headaches.  - has had adjustments in bp med over time. Recently started on HCTZ 25 mg and losartan 50mg .  - compliant with meds.  - she denies any chest pain, no SOB or DOE - home bp's remain elevated at times.   2. OSA screen - +snoring episodes, no apneic epsidoes, + daytime somnolence. Has had sleep studies several years ago she reports that was not significantly abnormal.     Past Medical History:  Diagnosis Date  . Anxiety   . Apnea    witnessed with snoring  . Chronic insomnia 12/24/2014  . 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 Hypertension   . Obesity   . OSA (obstructive sleep apnea)   . Seizures (HCC)      Allergies  Allergen Reactions  . Amoxicillin-Pot Clavulanate   . Penicillins Rash     Current Outpatient Prescriptions  Medication Sig Dispense Refill  . acetaminophen (TYLENOL) 500 MG tablet Take 1,000 mg by mouth daily.    . cetirizine (ZYRTEC) 10 MG tablet Take 10 mg by mouth daily.    . folic acid (FOLVITE) 1 MG tablet TAKE 1 TABLET BY MOUTH TWICE DAILY 60 tablet 12  . hydrochlorothiazide (HYDRODIURIL) 25 MG tablet Take 25 mg by mouth.    . labetalol (NORMODYNE) 100 MG tablet Take 100 mg by mouth 2 (two) times daily.  2  . lamoTRIgine (LAMICTAL) 200 MG tablet Take 1 tablet (200 mg total) by mouth 2 (two) times daily. 180 tablet 3  . levETIRAcetam (KEPPRA) 500 MG tablet Take 1 tablet (500 mg total) by mouth 2 (two) times daily. 180 tablet 3  . Multiple Vitamin (MULTIVITAMIN) tablet Take 1 tablet by mouth daily.    . traZODone (DESYREL) 50 MG tablet Take 2 tablets (100 mg total) by mouth at bedtime. 60 tablet 5   No current facility-administered medications for this visit.      Past Surgical History:    Procedure Laterality Date  . birthmark resection    . fallopian tube blockage resection    . OVARIAN CYST REMOVAL       Allergies  Allergen Reactions  . Amoxicillin-Pot Clavulanate   . Penicillins Rash      Family History  Problem Relation Age of Onset  . Colon cancer Mother   . Aneurysm Father   . Breast cancer Sister   . Diabetes Paternal Aunt   . Alzheimer's disease Maternal Grandmother      Social History [name] reports that she has quit smoking. Her smoking use included Cigarettes. She has never used smokeless tobacco. [name] reports that she does not drink alcohol.   Review of Systems CONSTITUTIONAL: No weight loss, fever, chills, weakness or fatigue.  HEENT: Eyes: No visual loss, blurred vision, double vision or yellow sclerae.No hearing loss, sneezing, congestion, runny nose or sore throat.  SKIN: No rash or itching.  CARDIOVASCULAR: per HPI RESPIRATORY: No shortness of breath, cough or sputum.  GASTROINTESTINAL: No anorexia, nausea, vomiting or diarrhea. No abdominal pain or blood.  GENITOURINARY: No burning on urination, no polyuria NEUROLOGICAL: No headache, dizziness, syncope, paralysis, ataxia, numbness or tingling in the extremities. No change in bowel or bladder control.  MUSCULOSKELETAL: No muscle, back  pain, joint pain or stiffness.  LYMPHATICS: No enlarged nodes. No history of splenectomy.  PSYCHIATRIC: No history of depression or anxiety.  ENDOCRINOLOGIC: No reports of sweating, cold or heat intolerance. No polyuria or polydipsia.  Marland Kitchen.   Physical Examination Vitals:   01/12/16 1335 01/12/16 1337  BP: (!) 146/84 121/89  Pulse: 76 81   Vitals:   01/12/16 1331  Weight: 276 lb (125.2 kg)  Height: 5\' 4"  (1.626 m)    Gen: resting comfortably, no acute distress HEENT: no scleral icterus, pupils equal round and reactive, no palptable cervical adenopathy,  CV: RRR, 3/6 systolic murmur RUSB, no jvd Resp: Clear to auscultation  bilaterally GI: abdomen is soft, non-tender, non-distended, normal bowel sounds, no hepatosplenomegaly MSK: extremities are warm, no edema.  Skin: warm, no rash Neuro:  no focal deficits Psych: appropriate affect    Assessment and Plan  1. HTN - uncontrolled. We will stop HCTZ and start chlorthalidone 25mg  daily for more potent bp effect. Increase labetolol to 200mg  bid. She reports some nonspecific side effects since starting losartan, we will hold this medication at this time.  - given info on DASH diet - submit bp log in 2 weeks. - does not meet critiera for resistant HTN at this time. Continue medication titration, if not able to control consider workup for secondary hypertension.  2. Heart murmur - obtain echo   F/u 3-4 weeks.  Antoine PocheJonathan F. Dnya Hickle, M.D.,

## 2016-01-12 NOTE — Patient Instructions (Addendum)
Medication Instructions:   Stop HCTZ.  Begin Chlorthalidone 25mg  daily.  Stop Losartan.  Increase Labetalol to 200mg  twice a day.   Continue all other medications.    Labwork: none  Testing/Procedures:  Your physician has requested that you have an echocardiogram. Echocardiography is a painless test that uses sound waves to create images of your heart. It provides your doctor with information about the size and shape of your heart and how well your heart's chambers and valves are working. This procedure takes approximately one hour. There are no restrictions for this procedure.  Office will contact with results via phone or letter.    Follow-Up:  3-4 weeks  Any Other Special Instructions Will Be Listed Below (If Applicable).  DASH diet - info provided today.   Your physician has requested that you regularly monitor and record your blood pressure readings at home x 1 week.  Please take your readings approximately 2 hours after medications.  Bring log to office for MD review.    If you need a refill on your cardiac medications before your next appointment, please call your pharmacy.

## 2016-01-28 ENCOUNTER — Other Ambulatory Visit: Payer: Self-pay

## 2016-01-28 ENCOUNTER — Ambulatory Visit (INDEPENDENT_AMBULATORY_CARE_PROVIDER_SITE_OTHER): Payer: BC Managed Care – PPO

## 2016-01-28 DIAGNOSIS — R011 Cardiac murmur, unspecified: Secondary | ICD-10-CM | POA: Diagnosis not present

## 2016-01-30 ENCOUNTER — Telehealth: Payer: Self-pay

## 2016-01-30 NOTE — Telephone Encounter (Signed)
-----   Message from Antoine PocheJonathan F Branch, MD sent at 01/30/2016  3:39 PM EDT ----- Echo looks good. Her heart murmur is not due to any issues with her heart valves, her heart beats very strong and moves the blood very quickly creating a mild murmur, this is a normal finding  Dominga FerryJ Branch MD

## 2016-01-30 NOTE — Telephone Encounter (Signed)
Called pt. No answer, left message for pt to return call.  

## 2016-02-20 ENCOUNTER — Ambulatory Visit: Payer: Self-pay | Admitting: Cardiology

## 2016-02-20 NOTE — Progress Notes (Deleted)
Clinical Summary [name] is a 40 y.o.female seen today for follow up of the following medical problems.  1. HTN - notes high bp's, 220s/140s. [facility] with high bp and headaches.  - has had adjustments in bp med over time. Recently started on HCTZ 25 mg and losartan 50mg .  - compliant with meds.  - she denies any chest pain, no SOB or DOE - home bp's remain elevated at times.   - last visit stopped HCTZ, started chlorthatlidone 25mg  daily and increased labetolol to 200mg  bid. Nonspecific side effects to losartan, held last visit.  ?follow up labs    2. OSA screen - +snoring episodes, no apneic epsidoes, + daytime somnolence. Has had sleep studies several years ago she reports that was not significantly abnormal.    3. Heart murmur - benign echo Past Medical History:  Diagnosis Date  . Anxiety   . Apnea    witnessed with snoring  . Chronic insomnia 12/24/2014  . 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 Hypertension   . Obesity   . OSA (obstructive sleep apnea)   . Seizures (HCC)      Allergies  Allergen Reactions  . Amoxicillin-Pot Clavulanate Rash  . Penicillins Rash     Current Outpatient Prescriptions  Medication Sig Dispense Refill  . acetaminophen (TYLENOL) 500 MG tablet Take 1,000 mg by mouth daily.    . cetirizine (ZYRTEC) 10 MG tablet Take 10 mg by mouth daily.    . chlorthalidone (HYGROTON) 25 MG tablet Take 1 tablet (25 mg total) by mouth daily. 30 tablet 6  . folic acid (FOLVITE) 1 MG tablet TAKE 1 TABLET BY MOUTH TWICE DAILY 60 tablet 12  . labetalol (NORMODYNE) 200 MG tablet Take 1 tablet (200 mg total) by mouth 2 (two) times daily. 60 tablet 6  . lamoTRIgine (LAMICTAL) 200 MG tablet Take 1 tablet (200 mg total) by mouth 2 (two) times daily. 180 tablet 3  . levETIRAcetam (KEPPRA) 500 MG tablet Take 1 tablet (500 mg total) by mouth 2 (two) times daily. 180 tablet 3  . Multiple  Vitamin (MULTIVITAMIN) tablet Take 1 tablet by mouth daily.    . traZODone (DESYREL) 50 MG tablet Take 2 tablets (100 mg total) by mouth at bedtime. 60 tablet 5   No current facility-administered medications for this visit.      Past Surgical History:  Procedure Laterality Date  . birthmark resection    . fallopian tube blockage resection    . OVARIAN CYST REMOVAL       Allergies  Allergen Reactions  . Amoxicillin-Pot Clavulanate Rash  . Penicillins Rash      Family History  Problem Relation Age of Onset  . Colon cancer Mother   . Aneurysm Father   . Breast cancer Sister   . Diabetes Paternal Aunt   . Alzheimer's disease Maternal Grandmother      Social History [name] reports that she quit smoking about 18 years ago. Her smoking use included Cigarettes. She started smoking about 21 years ago. She smoked 0.25 packs per day. She has never used smokeless tobacco. [name] reports that she does not drink alcohol.   Review of Systems CONSTITUTIONAL: No weight loss, fever, chills, weakness or fatigue.  HEENT: Eyes: No visual loss, blurred vision, double vision or yellow sclerae.No hearing loss, sneezing, congestion, runny nose or sore throat.  SKIN: No rash or itching.  CARDIOVASCULAR:  RESPIRATORY: No shortness of breath, cough or sputum.  GASTROINTESTINAL: No anorexia, nausea, vomiting or diarrhea. No abdominal pain or blood.  GENITOURINARY: No burning on urination, no polyuria NEUROLOGICAL: No headache, dizziness, syncope, paralysis, ataxia, numbness or tingling in the extremities. No change in bowel or bladder control.  MUSCULOSKELETAL: No muscle, back pain, joint pain or stiffness.  LYMPHATICS: No enlarged nodes. No history of splenectomy.  PSYCHIATRIC: No history of depression or anxiety.  ENDOCRINOLOGIC: No reports of sweating, cold or heat intolerance. No polyuria or polydipsia.  Marland Kitchen   Physical Examination There were no vitals filed for this visit. There  were no vitals filed for this visit.  Gen: resting comfortably, no acute distress HEENT: no scleral icterus, pupils equal round and reactive, no palptable cervical adenopathy,  CV Resp: Clear to auscultation bilaterally GI: abdomen is soft, non-tender, non-distended, normal bowel sounds, no hepatosplenomegaly MSK: extremities are warm, no edema.  Skin: warm, no rash Neuro:  no focal deficits Psych: appropriate affect   Diagnostic Studies  01/2016 echo Study Conclusions  - Left ventricle: The cavity size was normal. Wall thickness was   increased in a pattern of mild LVH. Systolic function was   vigorous. The estimated ejection fraction was in the range of 65%   to 70%. Wall motion was normal; there were no regional wall   motion abnormalities. Doppler parameters are consistent with   abnormal left ventricular relaxation (grade 1 diastolic   dysfunction). - Mitral valve: Calcified annulus. There was trivial regurgitation. - Left atrium: The atrium was mildly dilated. - Right atrium: Central venous pressure (est): 3 mm Hg. - Atrial septum: No defect or patent foramen ovale was identified. - Tricuspid valve: There was trivial regurgitation. - Pulmonary arteries: PA peak pressure: 14 mm Hg (S). - Pericardium, extracardiac: There was no pericardial effusion.  Impressions:  - Mild LVH with LVEF 65-70%. Grade 1 diastolic dysfunction. Mildly   dilated left atrium. Mild MAC with trivial mitral regurgitation.   Trivial tricuspid regurgitation with normal estimated PASP.   Assessment and Plan   1. HTN - uncontrolled. We will stop HCTZ and start chlorthalidone 25mg  daily for more potent bp effect. Increase labetolol to 200mg  bid. She reports some nonspecific side effects since starting losartan, we will hold this medication at this time.  - given info on DASH diet - submit bp log in 2 weeks. - does not meet critiera for resistant HTN at this time. Continue medication titration, if  not able to control consider workup for secondary hypertension.  2. Heart murmur - obtain echo   F/u 3-4 weeks.      Antoine Poche, M.D., F.A.C.C.

## 2016-02-23 ENCOUNTER — Encounter: Payer: Self-pay | Admitting: Cardiology

## 2016-05-19 DIAGNOSIS — Z0289 Encounter for other administrative examinations: Secondary | ICD-10-CM

## 2016-06-21 ENCOUNTER — Other Ambulatory Visit: Payer: Self-pay | Admitting: Neurology

## 2016-07-21 ENCOUNTER — Other Ambulatory Visit: Payer: Self-pay | Admitting: Cardiology

## 2016-07-23 ENCOUNTER — Other Ambulatory Visit: Payer: Self-pay | Admitting: Cardiology

## 2016-08-20 ENCOUNTER — Other Ambulatory Visit: Payer: Self-pay | Admitting: Cardiology

## 2016-10-04 ENCOUNTER — Other Ambulatory Visit: Payer: Self-pay | Admitting: Cardiology

## 2016-12-12 ENCOUNTER — Other Ambulatory Visit: Payer: Self-pay | Admitting: Neurology

## 2016-12-14 NOTE — Telephone Encounter (Signed)
Faxed printed/signed rx lamictal to pt pharamcy. Fax: [phone]. Received confirmation.

## 2016-12-23 ENCOUNTER — Ambulatory Visit (INDEPENDENT_AMBULATORY_CARE_PROVIDER_SITE_OTHER): Payer: BC Managed Care – PPO | Admitting: Neurology

## 2016-12-23 ENCOUNTER — Encounter: Payer: Self-pay | Admitting: Neurology

## 2016-12-23 ENCOUNTER — Encounter (INDEPENDENT_AMBULATORY_CARE_PROVIDER_SITE_OTHER): Payer: Self-pay

## 2016-12-23 VITALS — BP 121/80 | HR 75 | Ht 64.0 in | Wt 263.0 lb

## 2016-12-23 DIAGNOSIS — Z5181 Encounter for therapeutic drug level monitoring: Secondary | ICD-10-CM | POA: Diagnosis not present

## 2016-12-23 DIAGNOSIS — G40309 Generalized idiopathic epilepsy and epileptic syndromes, not intractable, without status epilepticus: Secondary | ICD-10-CM

## 2016-12-23 DIAGNOSIS — F5104 Psychophysiologic insomnia: Secondary | ICD-10-CM

## 2016-12-23 MED ORDER — LEVETIRACETAM 500 MG PO TABS: 500.0000 mg | ORAL_TABLET | Freq: Two times a day (BID) | ORAL | 3 refills | Status: DC

## 2016-12-23 MED ORDER — LAMOTRIGINE 200 MG PO TABS: 200.0000 mg | ORAL_TABLET | Freq: Two times a day (BID) | ORAL | 3 refills | Status: DC

## 2016-12-23 MED ORDER — TRAZODONE HCL 100 MG PO TABS
100.0000 mg | ORAL_TABLET | Freq: Every day | ORAL | 3 refills | Status: DC
Start: 2016-12-23 — End: 2017-12-23

## 2016-12-23 NOTE — Progress Notes (Signed)
Faxed printed/signed rx [facility] at [phone]. Received confirmation.

## 2016-12-23 NOTE — Progress Notes (Signed)
Reason for visit: Seizures  Rayna Violeta GelinasM Selinger is an 41 y.o. female  History of present illness:  [name] is a 41 year old right-handed white female with a history of seizures and obesity. The patient has done well since last seen, she has not had any recurrence of seizures. She is on Keppra and Lamictal and she takes trazodone at night for sleep. She is tolerating these medications as well, she does operate a motor vehicle. No other new medical issues have come up since last seen.   Past Medical History:  Diagnosis Date  . Anxiety   . Apnea    witnessed with snoring  . Chronic insomnia 12/24/2014  . 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 Hypertension   . Obesity   . OSA (obstructive sleep apnea)   . Seizures (HCC)     Past Surgical History:  Procedure Laterality Date  . birthmark resection    . fallopian tube blockage resection    . OVARIAN CYST REMOVAL      Family History  Problem Relation Age of Onset  . Colon cancer Mother   . Aneurysm Father   . Breast cancer Sister   . Diabetes Paternal Aunt   . Alzheimer's disease Maternal Grandmother     Social history:  reports that she quit smoking about 19 years ago. Her smoking use included Cigarettes. She started smoking about 22 years ago. She smoked 0.25 packs per day. She has never used smokeless tobacco. She reports that she does not drink alcohol or use drugs.    Allergies  Allergen Reactions  . Amoxicillin-Pot Clavulanate Rash  . Penicillins Rash    Medications:  Prior to Admission medications   Medication Sig Start Date End Date Taking? Authorizing Provider  acetaminophen (TYLENOL) 500 MG tablet Take 1,000 mg by mouth daily.   Yes [provider]  cetirizine (ZYRTEC) 10 MG tablet Take 10 mg by mouth daily.   Yes [provider]  chlorthalidone (HYGROTON) 25 MG tablet TAKE 1 TABLET BY MOUTH EVERY DAY - PATIENT NEEDS OFFICE VISIT FOR REFILLS 10/05/16   Yes Branch, Dorothe PeaJonathan F, MD  folic acid (FOLVITE) 1 MG tablet TAKE 1 TABLET BY MOUTH TWICE DAILY 12/31/14  Yes York SpanielWillis, Yamilette Garretson K, MD  labetalol (NORMODYNE) 200 MG tablet TAKE 1 TABLET BY MOUTH TWICE DAILY --DOSE INCREASE 07/23/16  Yes Branch, Dorothe PeaJonathan F, MD  lamoTRIgine (LAMICTAL) 200 MG tablet TAKE 1 TABLET BY MOUTH 2 TIMES DAILY. 12/14/16  Yes York SpanielWillis, Abie Cheek K, MD  levETIRAcetam (KEPPRA) 500 MG tablet TAKE 1 TABLET BY MOUTH 2 TIMES DAILY. 12/14/16  Yes York SpanielWillis, Bodee Lafoe K, MD  Multiple Vitamin (MULTIVITAMIN) tablet Take 1 tablet by mouth daily.   Yes [provider]  traZODone (DESYREL) 50 MG tablet TAKE 2 TABLETS BY MOUTH AT BEDTIME 12/14/16  Yes York SpanielWillis, Evy Lutterman K, MD    ROS:  Out of a complete 14 system review of symptoms, the patient complains only of the following symptoms, and all other reviewed systems are negative.  Snoring  Blood pressure 121/80, pulse 75, height 5\' 4"  (1.626 m), weight 263 lb (119.3 kg), SpO2 98 %.  Physical Exam  General: The patient is alert and cooperative at the time of the examination. The patient is markedly obese.  Skin: No significant peripheral edema is noted.   Neurologic Exam  Mental status: The patient is alert and oriented x 3 at the time of the examination. The patient has apparent normal recent and remote  memory, with an apparently normal attention span and concentration ability.   Cranial nerves: Facial symmetry is present. Speech is normal, no aphasia or dysarthria is noted. Extraocular movements are full. Visual fields are full.  Motor: The patient has good strength in all 4 extremities.  Sensory examination: Soft touch sensation is symmetric on the face, arms, and legs.  Coordination: The patient has good finger-nose-finger and heel-to-shin bilaterally.  Gait and station: The patient has a normal gait. Tandem gait is slightly unsteady. Romberg is negative. No drift is seen.  Reflexes: Deep tendon reflexes are  symmetric.   Assessment/Plan:  1. History of seizures, well controlled  2. Chronic insomnia  3. Obesity  The patient is doing well with her seizures, the last seizure was in January 2013. We will check blood work today, a prescription was sent in for her trazodone, Keppra, and Lamictal. The patient is able to sleep fairly well on 100 mg of trazodone at night. She will follow-up in one year, sooner if needed.  Marlan Palau. Keith Dago Jungwirth MD 12/23/2016 9:05 AM  [facility] [address] [place], [place] [id number]  Phone [phone] Fax [phone]

## 2016-12-26 ENCOUNTER — Other Ambulatory Visit: Payer: Self-pay | Admitting: Cardiology

## 2016-12-26 LAB — COMPREHENSIVE METABOLIC PANEL
A/G RATIO: 1.8 (ref 1.2–2.2)
ALBUMIN: 4.4 g/dL (ref 3.5–5.5)
ALK PHOS: 92 IU/L (ref 39–117)
ALT: 29 IU/L (ref 0–32)
AST: 24 IU/L (ref 0–40)
BILIRUBIN TOTAL: 0.3 mg/dL (ref 0.0–1.2)
BUN / CREAT RATIO: 18 (ref 9–23)
BUN: 13 mg/dL (ref 6–24)
CHLORIDE: 99 mmol/L (ref 96–106)
CO2: 22 mmol/L (ref 20–29)
Calcium: 9.4 mg/dL (ref 8.7–10.2)
Creatinine, Ser: 0.72 mg/dL (ref 0.57–1.00)
GFR calc non Af Amer: 103 mL/min/{1.73_m2} (ref >59)
GFR, EST AFRICAN AMERICAN: 119 mL/min/{1.73_m2} (ref >59)
Globulin, Total: 2.4 g/dL (ref 1.5–4.5)
Glucose: 77 mg/dL (ref 65–99)
POTASSIUM: 4.5 mmol/L (ref 3.5–5.2)
SODIUM: 135 mmol/L (ref 134–144)
TOTAL PROTEIN: 6.8 g/dL (ref 6.0–8.5)

## 2016-12-26 LAB — LAMOTRIGINE LEVEL: LAMOTRIGINE LVL: 8.1 ug/mL (ref 2.0–20.0)

## 2016-12-26 LAB — CBC WITH DIFFERENTIAL/PLATELET
BASOS ABS: 0 10*3/uL (ref 0.0–0.2)
Basos: 1 %
EOS (ABSOLUTE): 0 10*3/uL (ref 0.0–0.4)
Eos: 1 %
HEMOGLOBIN: 11.7 g/dL (ref 11.1–15.9)
Hematocrit: 34.2 % (ref 34.0–46.6)
Immature Grans (Abs): 0 10*3/uL (ref 0.0–0.1)
Immature Granulocytes: 0 %
LYMPHS ABS: 1.9 10*3/uL (ref 0.7–3.1)
Lymphs: 29 %
MCH: 29.3 pg (ref 26.6–33.0)
MCHC: 34.2 g/dL (ref 31.5–35.7)
MCV: 86 fL (ref 79–97)
MONOCYTES: 7 %
MONOS ABS: 0.4 10*3/uL (ref 0.1–0.9)
NEUTROS ABS: 4.1 10*3/uL (ref 1.4–7.0)
Neutrophils: 62 %
Platelets: 342 10*3/uL (ref 150–379)
RBC: 4 x10E6/uL (ref 3.77–5.28)
RDW: 13.3 % (ref 12.3–15.4)
WBC: 6.5 10*3/uL (ref 3.4–10.8)

## 2016-12-26 LAB — LEVETIRACETAM LEVEL: LEVETIRACETAM: 15.7 ug/mL (ref 10.0–40.0)

## 2016-12-27 ENCOUNTER — Telehealth: Payer: Self-pay | Admitting: *Deleted

## 2016-12-27 NOTE — Telephone Encounter (Signed)
-----   Message from York Spanielharles K Willis, MD sent at 12/26/2016 10:29 AM EDT -----  The blood work results are unremarkable. Good lamotrigine level and keppra level. ----- Message ----- From: Interface, [facility] Lab Results In Sent: 12/24/2016   7:44 AM To: York Spanielharles K Willis, MD

## 2016-12-27 NOTE — Telephone Encounter (Signed)
Called and LVM for pt about unremarkable labs per CW,MD note. Advised she had good lamotrigine and keppra levels. Gave [facility] phone number if she has further questions or concerns.

## 2017-02-09 ENCOUNTER — Other Ambulatory Visit: Payer: Self-pay | Admitting: Cardiology

## 2017-02-18 ENCOUNTER — Other Ambulatory Visit: Payer: Self-pay | Admitting: Cardiology

## 2017-07-13 ENCOUNTER — Other Ambulatory Visit (HOSPITAL_COMMUNITY): Payer: Self-pay | Admitting: Physician Assistant

## 2017-07-13 DIAGNOSIS — R1011 Right upper quadrant pain: Secondary | ICD-10-CM

## 2017-07-20 ENCOUNTER — Ambulatory Visit (HOSPITAL_COMMUNITY): Admission: RE | Admit: 2017-07-20 | Payer: BC Managed Care – PPO | Source: Ambulatory Visit

## 2017-12-01 ENCOUNTER — Encounter: Payer: Self-pay | Admitting: Allergy

## 2017-12-01 ENCOUNTER — Ambulatory Visit: Payer: BC Managed Care – PPO | Admitting: Allergy

## 2017-12-01 VITALS — BP 126/86 | HR 76 | Temp 98.1°F | Resp 16 | Ht 64.0 in | Wt 268.0 lb

## 2017-12-01 DIAGNOSIS — H1010 Acute atopic conjunctivitis, unspecified eye: Secondary | ICD-10-CM

## 2017-12-01 DIAGNOSIS — J309 Allergic rhinitis, unspecified: Secondary | ICD-10-CM

## 2017-12-01 DIAGNOSIS — J329 Chronic sinusitis, unspecified: Secondary | ICD-10-CM

## 2017-12-01 MED ORDER — AZELASTINE-FLUTICASONE 137-50 MCG/ACT NA SUSP: 2.0000 | NASAL | 5 refills | Status: DC

## 2017-12-01 NOTE — Progress Notes (Signed)
New Patient Note  RE: Taylor Collier MRN: [id number] DOB: [birth year] Date of Office Visit: 12/01/2017  Referring provider: Lawerance Sabal, PA Primary care provider: Juliette Alcide, MD  Chief Complaint: sinus problems  History of present illness: Taylor Collier is a 42 y.o. female presenting today for consultation for sinus issues.   She reports she has had a lot of sinus infections.  Since January 2019 she has had 4-5 sinus infections.   She states for the past 5 years she reports average at least 3 infections/year.  She reports symptoms of pressure and pain in forehead and around eyes, nasal drainage with post-nasal drip, "achey in the head".  She reports having these symptoms for about 3 days before she goes to be seen by PCP.  She is normally treated with one course of antibiotics.  She has been treated with prednisone but does not routinely get prednisone each time.    She reports she has had ear infections as a adult and has had 2 in the last year.   She had PNA once in the past year confirmed on CXR.   No IV antibiotics needs or opportunistic infections.   No family history of immunodeficieny.    She states she does have environmental allergies.  She has a lot of sneezing, nasal congestion/drainage, watery eyes during spring and fall.  She has tried claritin, zyrtec, Careers adviser, flonase.  She uses flonase 2 sprays each nostril in AM and zyrtec in PM (makes her sleepy).  She feels these medications help some.  For HA symptoms she takes tylenol.   She was on allergen immunotherapy about 20 years ago and did so for about 2 years.   She works in a school as an Insurance account manager.   No history of asthma or eczema or food allergy.    Review of systems: Review of Systems  Constitutional: Negative for chills, fever and malaise/fatigue.  HENT: Positive for congestion. Negative for ear discharge, ear pain, nosebleeds, sinus pain and sore throat.   Eyes: Negative for pain, discharge and  redness.  Respiratory: Negative for cough, shortness of breath and wheezing.   Cardiovascular: Negative for chest pain.  Gastrointestinal: Negative for abdominal pain, constipation, diarrhea, heartburn, nausea and vomiting.  Musculoskeletal: Negative for joint pain.  Skin: Negative for itching and rash.  Neurological: Negative for headaches.    All other systems negative unless noted above in HPI  Past medical history: Past Medical History:  Diagnosis Date  . Anxiety   . Apnea    witnessed with snoring  . Chronic insomnia 12/24/2014  . 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 Hypertension   . Obesity   . OSA (obstructive sleep apnea)   . Seizures (HCC)     Past surgical history: Past Surgical History:  Procedure Laterality Date  . birthmark resection    . fallopian tube blockage resection    . OVARIAN CYST REMOVAL      Family history:  Family History  Problem Relation Age of Onset  . Colon cancer Mother   . Aneurysm Father   . Breast cancer Sister   . Diabetes Paternal Aunt   . Alzheimer's disease Maternal Grandmother   . Uterine cancer Maternal Grandmother     Social history: She lives in a home with carpeting with gas heating and central cooling.  No pets in the home.  There is no concern for roaches in the home.  There is concern for  water damage or mildew in the home.  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 Tobacco Use  . Smoking status: Former Smoker    Packs/day: 0.25    Types: Cigarettes    Start date: 09/23/1994  . Smokeless tobacco: Never Used  . Tobacco comment: quit 2003    Medication List: Allergies as of 12/01/2017      Reactions   Amoxicillin-pot Clavulanate Rash   Penicillins Rash      Medication List        Accurate as of 12/01/17 11:46 AM. Always use your most recent med list.          acetaminophen 500 MG tablet Commonly known as:  TYLENOL Take 1,000 mg by mouth  daily.   cetirizine 10 MG tablet Commonly known as:  ZYRTEC Take 10 mg by mouth daily.   chlorthalidone 25 MG tablet Commonly known as:  HYGROTON TAKE ONE TABLET BY MOUTH EVERY DAY - PT. NEEDS OFFICE VISIT   folic acid 1 MG tablet Commonly known as:  FOLVITE TAKE 1 TABLET BY MOUTH TWICE DAILY   labetalol 200 MG tablet Commonly known as:  NORMODYNE TAKE 1 TABLET BY MOUTH TWICE DAILY - DOSE INCREASE   lamoTRIgine 200 MG tablet Commonly known as:  LAMICTAL Take 1 tablet (200 mg total) by mouth 2 (two) times daily.   levETIRAcetam 500 MG tablet Commonly known as:  KEPPRA Take 1 tablet (500 mg total) by mouth 2 (two) times daily.   multivitamin tablet Take 1 tablet by mouth daily.   traZODone 100 MG tablet Commonly known as:  DESYREL Take 1 tablet (100 mg total) by mouth at bedtime.       Known medication allergies: Allergies  Allergen Reactions  . Amoxicillin-Pot Clavulanate Rash  . Penicillins Rash     Physical examination: Blood pressure 126/86, pulse 76, temperature 98.1 F (36.7 C), temperature source Oral, resp. rate 16, height 5\' 4"  (1.626 m), weight 268 lb (121.6 kg), SpO2 96 %.  General: Alert, interactive, in no acute distress. HEENT: PERRLA, TMs pearly gray, turbinates mildly edematous with clear discharge, post-pharynx non erythematous, tonsils present symmetric and not enlarged Neck: Supple without lymphadenopathy. Lungs: Clear to auscultation without wheezing, rhonchi or rales. {no increased work of breathing. CV: Normal S1, S2 without murmurs. Abdomen: Nondistended, nontender. Skin: Warm and dry, without lesions or rashes. Extremities:  No clubbing, cyanosis or edema. Neuro:   Grossly intact.  Diagnositics/Labs:  Allergy testing: environmental allergy skin prick testing is positive to cocklebur, burweed marshelder, [employer], pullulara Allergy testing results were read and interpreted by provider, documented by clinical staff.   Assessment and  plan:   Recurrent sinus infections     - the amount of sinus infections as well as ear infections you have per year warrant an immunocompetence work-up.   Will obtain the following labs: CBC w diff, CMP, immunoglobulins, vaccine titers    - will call with these results once they return  Allergic rhinoconjunctivitis    - environmental allergy skin testing is positive to weeds and tree pollens as well as mold.       - allergen avoidance measures discussed and handouts provided    - trial Xyzal 5mg  daily     - trial dymista 1 spray each nostril twice a day.  This is a combination nasal spray with Flonase + Astelin (nasal antihistamine).  This helps with both nasal congestion and drainage.   If insurance does not cover this medication  then use the below 2 nasal sprays as follows:     - use Flonase 2 sprays each nostril daily for nasal congestion for 1-2 weeks at a time before stopping once improved    - for nasal drainage recommend use of nasal antihistamine Astelin 2 sprays each nostril twice a day as needed.      - allergen immunotherapy discussed today including protocol, benefits and risk.  Informational handout provided.  If interested in this therapuetic option you can check with your insurance carrier for coverage.  Let us know if you would like to proceed with this option.    Follow-up 3-4 months or sooner if needed  I appreciate the opportunity to take part in Janel's care. Please do not hesitate to contact me with questions.  Sincerely,   Margo AyeShaylar Dmitry Macomber, MD Allergy/Immunology [facility]

## 2017-12-01 NOTE — Patient Instructions (Addendum)
Recurrent sinus infections     - the amount of sinus infections as well as ear infections you have per year warrant an immunocompetence work-up.   Will obtain the following labs: CBC w diff, CMP, immunoglobulins, vaccine titers    - will call with these results once they return  Allergic rhinoconjunctivitis    - environmental allergy skin testing is positive to weeds and tree pollens as well as mold.       - allergen avoidance measures discussed and handouts provided    - trial Xyzal 5mg  daily     - trial dymista 1 spray each nostril twice a day.  This is a combination nasal spray with Flonase + Astelin (nasal antihistamine).  This helps with both nasal congestion and drainage.   If insurance does not cover this medication then use the below 2 nasal sprays as follows:     - use Flonase 2 sprays each nostril daily for nasal congestion for 1-2 weeks at a time before stopping once improved    - for nasal drainage recommend use of nasal antihistamine Astelin 2 sprays each nostril twice a day as needed.      - allergen immunotherapy discussed today including protocol, benefits and risk.  Informational handout provided.  If interested in this therapuetic option you can check with your insurance carrier for coverage.  Let us know if you would like to proceed with this option.    Follow-up 3-4 months or sooner if needed

## 2017-12-06 LAB — STREP PNEUMONIAE 23 SEROTYPES IGG
PNEUMO AB TYPE 19 (19F): 0.5 ug/mL — AB (ref >1.3)
PNEUMO AB TYPE 1: 0.1 ug/mL — AB (ref >1.3)
PNEUMO AB TYPE 2: 0.2 ug/mL — AB (ref >1.3)
PNEUMO AB TYPE 3: 0.2 ug/mL — AB (ref >1.3)
PNEUMO AB TYPE 43 (11A): 0.3 ug/mL — AB (ref >1.3)
Pneumo Ab Type 12 (12F)*: <0.1 ug/mL — ABNORMAL LOW (ref >1.3)
Pneumo Ab Type 14*: 1.3 ug/mL — ABNORMAL LOW (ref >1.3)
Pneumo Ab Type 20*: <0.1 ug/mL — ABNORMAL LOW (ref >1.3)
Pneumo Ab Type 26 (6B)*: <0.1 ug/mL — ABNORMAL LOW (ref >1.3)
Pneumo Ab Type 34 (10A)*: <0.1 ug/mL — ABNORMAL LOW (ref >1.3)
Pneumo Ab Type 4*: <0.1 ug/mL — ABNORMAL LOW (ref >1.3)
Pneumo Ab Type 51 (7F)*: <0.1 ug/mL — ABNORMAL LOW (ref >1.3)
Pneumo Ab Type 57 (19A)*: 0.1 ug/mL — ABNORMAL LOW (ref >1.3)
Pneumo Ab Type 68 (9V)*: <0.1 ug/mL — ABNORMAL LOW (ref >1.3)
Pneumo Ab Type 8*: 2.4 ug/mL (ref >1.3)
Pneumo Ab Type 9 (9N)*: <0.1 ug/mL — ABNORMAL LOW (ref >1.3)

## 2017-12-06 LAB — IGE+ALLERGENS ZONE 2(30)
Amer Sycamore IgE Qn: <0.1 kU/L
Aspergillus Fumigatus IgE: <0.1 kU/L
Bahia Grass IgE: <0.1 kU/L
Cat Dander IgE: <0.1 kU/L
Cladosporium Herbarum IgE: <0.1 kU/L
Cockroach, American IgE: <0.1 kU/L
D Farinae IgE: <0.1 kU/L
D Pteronyssinus IgE: <0.1 kU/L
Elm, American IgE: <0.1 kU/L
Hickory, White IgE: <0.1 kU/L
IGE (IMMUNOGLOBULIN E), SERUM: 4 [IU]/mL — AB (ref 6–495)
Maple/Box Elder IgE: <0.1 kU/L
Pigweed, Rough IgE: <0.1 kU/L
Sheep Sorrel IgE Qn: <0.1 kU/L
Sweet gum IgE RAST Ql: <0.1 kU/L
White Mulberry IgE: <0.1 kU/L

## 2017-12-06 LAB — COMPREHENSIVE METABOLIC PANEL
ALBUMIN: 4.3 g/dL (ref 3.5–5.5)
ALT: 35 IU/L — ABNORMAL HIGH (ref 0–32)
AST: 27 IU/L (ref 0–40)
Albumin/Globulin Ratio: 1.7 (ref 1.2–2.2)
Alkaline Phosphatase: 81 IU/L (ref 39–117)
BILIRUBIN TOTAL: 0.3 mg/dL (ref 0.0–1.2)
BUN / CREAT RATIO: 13 (ref 9–23)
BUN: 9 mg/dL (ref 6–24)
CHLORIDE: 98 mmol/L (ref 96–106)
CO2: 24 mmol/L (ref 20–29)
Calcium: 9.8 mg/dL (ref 8.7–10.2)
Creatinine, Ser: 0.67 mg/dL (ref 0.57–1.00)
GFR calc non Af Amer: 107 mL/min/{1.73_m2} (ref >59)
GFR, EST AFRICAN AMERICAN: 124 mL/min/{1.73_m2} (ref >59)
GLUCOSE: 79 mg/dL (ref 65–99)
Globulin, Total: 2.5 g/dL (ref 1.5–4.5)
Potassium: 4.2 mmol/L (ref 3.5–5.2)
Sodium: 139 mmol/L (ref 134–144)
TOTAL PROTEIN: 6.8 g/dL (ref 6.0–8.5)

## 2017-12-06 LAB — DIPHTHERIA / TETANUS ANTIBODY PANEL
DIPHTHERIA AB: 0.16 [IU]/mL (ref <0.10)
Tetanus Ab, IgG: 0.48 IU/mL (ref <0.10)

## 2017-12-06 LAB — CBC WITH DIFFERENTIAL/PLATELET
BASOS: 0 %
Basophils Absolute: 0 10*3/uL (ref 0.0–0.2)
EOS (ABSOLUTE): 0 10*3/uL (ref 0.0–0.4)
EOS: 0 %
HEMOGLOBIN: 12 g/dL (ref 11.1–15.9)
Hematocrit: 36.3 % (ref 34.0–46.6)
IMMATURE GRANS (ABS): 0 10*3/uL (ref 0.0–0.1)
Immature Granulocytes: 0 %
LYMPHS: 23 %
Lymphocytes Absolute: 1.9 10*3/uL (ref 0.7–3.1)
MCH: 28.4 pg (ref 26.6–33.0)
MCHC: 33.1 g/dL (ref 31.5–35.7)
MCV: 86 fL (ref 79–97)
MONOCYTES: 5 %
Monocytes Absolute: 0.5 10*3/uL (ref 0.1–0.9)
NEUTROS ABS: 5.9 10*3/uL (ref 1.4–7.0)
Neutrophils: 72 %
Platelets: 357 10*3/uL (ref 150–450)
RBC: 4.22 x10E6/uL (ref 3.77–5.28)
RDW: 14.1 % (ref 12.3–15.4)
WBC: 8.3 10*3/uL (ref 3.4–10.8)

## 2017-12-06 LAB — IGG, IGA, IGM
IgA/Immunoglobulin A, Serum: 208 mg/dL (ref 87–352)
IgG (Immunoglobin G), Serum: 819 mg/dL (ref 700–1600)
IgM (Immunoglobulin M), Srm: 66 mg/dL (ref 26–217)

## 2017-12-14 ENCOUNTER — Ambulatory Visit (INDEPENDENT_AMBULATORY_CARE_PROVIDER_SITE_OTHER): Payer: BC Managed Care – PPO | Admitting: *Deleted

## 2017-12-14 DIAGNOSIS — Z23 Encounter for immunization: Secondary | ICD-10-CM | POA: Diagnosis not present

## 2017-12-14 DIAGNOSIS — J329 Chronic sinusitis, unspecified: Secondary | ICD-10-CM

## 2017-12-14 MED ORDER — PNEUMOCOCCAL VAC POLYVALENT 25 MCG/0.5ML IJ INJ
0.5000 mL | INJECTION | Freq: Once | INTRAMUSCULAR | Status: AC
  Administered 2017-12-14: 0.5 mL via INTRAMUSCULAR

## 2017-12-14 NOTE — Progress Notes (Signed)
Patient here to receive pnuemovax 23 injection. Given lot [id number] exp 01/01/2019. Waited 15 min post injection without difficulties. Patient did receive a VIS sheet and explained side effect patient verbalized understanding.

## 2017-12-23 ENCOUNTER — Other Ambulatory Visit: Payer: Self-pay

## 2017-12-23 ENCOUNTER — Encounter: Payer: Self-pay | Admitting: Neurology

## 2017-12-23 ENCOUNTER — Ambulatory Visit: Payer: BC Managed Care – PPO | Admitting: Neurology

## 2017-12-23 VITALS — BP 142/104 | HR 74 | Ht 64.0 in | Wt 268.0 lb

## 2017-12-23 DIAGNOSIS — G479 Sleep disorder, unspecified: Secondary | ICD-10-CM | POA: Diagnosis not present

## 2017-12-23 DIAGNOSIS — Z5181 Encounter for therapeutic drug level monitoring: Secondary | ICD-10-CM | POA: Diagnosis not present

## 2017-12-23 DIAGNOSIS — G40309 Generalized idiopathic epilepsy and epileptic syndromes, not intractable, without status epilepticus: Secondary | ICD-10-CM | POA: Diagnosis not present

## 2017-12-23 DIAGNOSIS — F5104 Psychophysiologic insomnia: Secondary | ICD-10-CM | POA: Diagnosis not present

## 2017-12-23 MED ORDER — LAMOTRIGINE 200 MG PO TABS: 200.0000 mg | ORAL_TABLET | Freq: Two times a day (BID) | ORAL | 3 refills | Status: DC

## 2017-12-23 MED ORDER — TRAZODONE HCL 150 MG PO TABS: 150.0000 mg | ORAL_TABLET | Freq: Every day | ORAL | 3 refills | Status: DC

## 2017-12-23 MED ORDER — LEVETIRACETAM 500 MG PO TABS: 500.0000 mg | ORAL_TABLET | Freq: Two times a day (BID) | ORAL | 3 refills | Status: DC

## 2017-12-23 NOTE — Progress Notes (Signed)
Faxed printed/signed rx [facility]ine to [facility] at [phone]. Received fax confirmation.

## 2017-12-23 NOTE — Progress Notes (Signed)
Reason for visit: Seizures  Taylor Collier is an 42 y.o. female  History of present illness:  [name] is a 42 year old right-handed white female with a history of obesity and seizures.  The patient has done well with her seizure control, she remains on Lamictal and Keppra.  The patient is tolerating these medications well.  She in the past has had a sleep evaluation, she was told she had mild sleep apnea but did not require therapy for this.  Her snoring has worsened over time, she is sleeping about 7 hours at night but still feels tired and fatigued during the day.  The patient does operate a motor vehicle without difficulty.  She takes trazodone at night for sleep, at times she does have difficulty getting to sleep but when she does get to sleep she can sleep uninterrupted throughout the night.  The patient returns to this office for an evaluation.  Past Medical History:  Diagnosis Date  . Anxiety   . Apnea    witnessed with snoring  . Chronic insomnia 12/24/2014  . 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 Hypertension   . Obesity   . OSA (obstructive sleep apnea)   . Seizures (HCC)     Past Surgical History:  Procedure Laterality Date  . birthmark resection    . fallopian tube blockage resection    . OVARIAN CYST REMOVAL      Family History  Problem Relation Age of Onset  . Colon cancer Mother   . Aneurysm Father   . Breast cancer Sister   . Diabetes Paternal Aunt   . Alzheimer's disease Maternal Grandmother   . Uterine cancer Maternal Grandmother     Social history:  reports that she quit smoking about 20 years ago. Her smoking use included cigarettes. She started smoking about 23 years ago. She smoked 0.25 packs per day. She has never used smokeless tobacco. She reports that she has current or past drug history. Drug: Psilocybin. She reports that she does not drink alcohol.    Allergies  Allergen Reactions  .  Amoxicillin-Pot Clavulanate Rash  . Penicillins Rash    Medications:  Prior to Admission medications   Medication Sig Start Date End Date Taking? Authorizing Provider  acetaminophen (TYLENOL) 500 MG tablet Take 1,000 mg by mouth daily.   Yes [provider]  Azelastine-Fluticasone (DYMISTA) 137-50 MCG/ACT SUSP Place 2 sprays into the nose.   Yes [provider]  chlorthalidone (HYGROTON) 25 MG tablet TAKE ONE TABLET BY MOUTH EVERY DAY - PT. NEEDS OFFICE VISIT 12/27/16  Yes Branch, Dorothe PeaJonathan F, MD  folic acid (FOLVITE) 1 MG tablet TAKE 1 TABLET BY MOUTH TWICE DAILY 12/31/14  Yes York SpanielWillis, Charles K, MD  labetalol (NORMODYNE) 200 MG tablet TAKE 1 TABLET BY MOUTH TWICE DAILY - DOSE INCREASE 02/09/17  Yes Branch, Dorothe PeaJonathan F, MD  lamoTRIgine (LAMICTAL) 200 MG tablet Take 1 tablet (200 mg total) by mouth 2 (two) times daily. 12/23/16  Yes York SpanielWillis, Charles K, MD  levETIRAcetam (KEPPRA) 500 MG tablet Take 1 tablet (500 mg total) by mouth 2 (two) times daily. 12/23/16  Yes York SpanielWillis, Charles K, MD  Multiple Vitamin (MULTIVITAMIN) tablet Take 1 tablet by mouth daily.   Yes [provider]  traZODone (DESYREL) 100 MG tablet Take 1 tablet (100 mg total) by mouth at bedtime. 12/23/16  Yes York SpanielWillis, Charles K, MD    ROS:  Out of a complete 14 system review of symptoms,  the patient complains only of the following symptoms, and all other reviewed systems are negative.  Excessive thirst Snoring  Blood pressure (!) 142/104, pulse 74, height 5\' 4"  (1.626 m), weight 268 lb (121.6 kg).   Repeat blood pressure, right arm, sitting is 140/96.  Physical Exam  General: The patient is alert and cooperative at the time of the examination.  The patient is markedly obese.  Skin: No significant peripheral edema is noted.   Neurologic Exam  Mental status: The patient is alert and oriented x 3 at the time of the examination. The patient has apparent normal recent and remote memory, with an apparently  normal attention span and concentration ability.   Cranial nerves: Facial symmetry is present. Speech is normal, no aphasia or dysarthria is noted. Extraocular movements are full. Visual fields are full.  Motor: The patient has good strength in all 4 extremities.  Sensory examination: Soft touch sensation is symmetric on the face, arms, and legs.  Coordination: The patient has good finger-nose-finger and heel-to-shin bilaterally.  Gait and station: The patient has a normal gait. Tandem gait is normal. Romberg is negative. No drift is seen.  Reflexes: Deep tendon reflexes are symmetric.   Assessment/Plan:  1.  History of seizures, well controlled  2.  Excessive daytime drowsiness  The patient will have another sleep referral, she did have a sleep evaluation several years ago but she believes that her issues have worsened over time.  The patient will be given prescriptions for trazodone, will go up to the 150 mg at night.  The patient will be given a prescription for Keppra and for Lamictal.  Blood work will be done today.  She will follow-up in 1 year, sooner if needed.  Marlan Palau MD 12/23/2017 8:05 AM  [facility] [address] [place], [place] [address]  Phone [phone] Fax [phone]

## 2017-12-23 NOTE — Patient Instructions (Signed)
   We will increase the Trazodone to 150 mg at night.

## 2017-12-26 ENCOUNTER — Telehealth: Payer: Self-pay | Admitting: *Deleted

## 2017-12-26 LAB — LAMOTRIGINE LEVEL: Lamotrigine Lvl: 7 ug/mL (ref 2.0–20.0)

## 2017-12-26 LAB — LEVETIRACETAM LEVEL: Levetiracetam Lvl: 18.7 ug/mL (ref 10.0–40.0)

## 2017-12-26 NOTE — Telephone Encounter (Signed)
-----   Message from York Spanielharles K Willis, MD sent at 12/26/2017  7:20 AM EDT ----- Lamotrigine and keppra levels are therapeutic, no change in dosing. Please call the patient. ----- Message ----- From: Nell RangeInterface, [facility] Lab Results In Sent: 12/25/2017   4:35 PM To: York Spanielharles K Willis, MD

## 2017-12-26 NOTE — Telephone Encounter (Signed)
Called, LVM for pt about lab results per Dr. Willis note. Gave [facility] phone number if she has further questions/concerns.  

## 2018-01-05 DIAGNOSIS — J301 Allergic rhinitis due to pollen: Secondary | ICD-10-CM | POA: Diagnosis not present

## 2018-01-05 NOTE — Addendum Note (Signed)
Addended by: Lorrin MaisPADGETT, Layman Gully P on: 01/05/2018 08:42 AM   Modules accepted: Orders

## 2018-01-05 NOTE — Progress Notes (Signed)
VIAL EXP 01-06-19

## 2018-01-17 ENCOUNTER — Ambulatory Visit (INDEPENDENT_AMBULATORY_CARE_PROVIDER_SITE_OTHER): Payer: BC Managed Care – PPO | Admitting: *Deleted

## 2018-01-17 DIAGNOSIS — J309 Allergic rhinitis, unspecified: Secondary | ICD-10-CM

## 2018-01-18 MED ORDER — EPINEPHRINE 0.3 MG/0.3ML IJ SOAJ: 0.3000 mg | Freq: Once | INTRAMUSCULAR | 1 refills | Status: AC

## 2018-01-18 NOTE — Addendum Note (Signed)
Addended by: Illene BolusFIELDS, Cathalina Barcia M on: 01/18/2018 12:22 PM   Modules accepted: Orders

## 2018-01-18 NOTE — Progress Notes (Signed)
Immunotherapy   Patient Details  Name: Taylor CousinLorrie M Perno MRN: [id number] Date of Birth: 09/04/1975  01/17/2018  [facility] Pollen, 1:100,000, 0.05 ml given.  Following schedule: B  Frequency: 1-2 times per week with 48 hours in between injections. Epi-Pen: [facility] Consent signed and patient instructions given.  Patient waited 30 minutes after injection and no issues.   Shelba Flakelizabeth Matricia Begnaud 01/18/2018, 12:11 PM

## 2018-01-24 ENCOUNTER — Ambulatory Visit (INDEPENDENT_AMBULATORY_CARE_PROVIDER_SITE_OTHER): Payer: BC Managed Care – PPO

## 2018-01-24 DIAGNOSIS — J309 Allergic rhinitis, unspecified: Secondary | ICD-10-CM

## 2018-01-31 ENCOUNTER — Ambulatory Visit (INDEPENDENT_AMBULATORY_CARE_PROVIDER_SITE_OTHER): Payer: BC Managed Care – PPO | Admitting: *Deleted

## 2018-01-31 DIAGNOSIS — J309 Allergic rhinitis, unspecified: Secondary | ICD-10-CM | POA: Diagnosis not present

## 2018-02-07 ENCOUNTER — Other Ambulatory Visit: Payer: Self-pay | Admitting: Cardiology

## 2018-02-07 ENCOUNTER — Ambulatory Visit (INDEPENDENT_AMBULATORY_CARE_PROVIDER_SITE_OTHER): Payer: BC Managed Care – PPO | Admitting: *Deleted

## 2018-02-07 DIAGNOSIS — J309 Allergic rhinitis, unspecified: Secondary | ICD-10-CM | POA: Diagnosis not present

## 2018-02-14 ENCOUNTER — Institutional Professional Consult (permissible substitution): Payer: BC Managed Care – PPO | Admitting: Neurology

## 2018-02-14 ENCOUNTER — Telehealth: Payer: Self-pay | Admitting: Neurology

## 2018-02-14 NOTE — Telephone Encounter (Signed)
Pt was no show to apt today 

## 2018-02-15 ENCOUNTER — Encounter: Payer: Self-pay | Admitting: Neurology

## 2018-02-21 ENCOUNTER — Ambulatory Visit (INDEPENDENT_AMBULATORY_CARE_PROVIDER_SITE_OTHER): Payer: BC Managed Care – PPO | Admitting: *Deleted

## 2018-02-21 DIAGNOSIS — J309 Allergic rhinitis, unspecified: Secondary | ICD-10-CM

## 2018-02-28 ENCOUNTER — Ambulatory Visit (INDEPENDENT_AMBULATORY_CARE_PROVIDER_SITE_OTHER): Payer: BC Managed Care – PPO

## 2018-02-28 DIAGNOSIS — J309 Allergic rhinitis, unspecified: Secondary | ICD-10-CM

## 2018-03-01 ENCOUNTER — Other Ambulatory Visit: Payer: Self-pay | Admitting: Cardiology

## 2018-03-08 ENCOUNTER — Ambulatory Visit (INDEPENDENT_AMBULATORY_CARE_PROVIDER_SITE_OTHER): Payer: BC Managed Care – PPO | Admitting: *Deleted

## 2018-03-08 DIAGNOSIS — J309 Allergic rhinitis, unspecified: Secondary | ICD-10-CM

## 2018-03-15 ENCOUNTER — Ambulatory Visit (INDEPENDENT_AMBULATORY_CARE_PROVIDER_SITE_OTHER): Payer: BC Managed Care – PPO

## 2018-03-15 DIAGNOSIS — J309 Allergic rhinitis, unspecified: Secondary | ICD-10-CM | POA: Diagnosis not present

## 2018-03-22 ENCOUNTER — Ambulatory Visit: Payer: BC Managed Care – PPO | Admitting: Allergy & Immunology

## 2018-03-22 ENCOUNTER — Encounter: Payer: Self-pay | Admitting: Allergy & Immunology

## 2018-03-22 VITALS — BP 120/90 | HR 96 | Resp 18

## 2018-03-22 DIAGNOSIS — J309 Allergic rhinitis, unspecified: Secondary | ICD-10-CM | POA: Diagnosis not present

## 2018-03-22 DIAGNOSIS — J301 Allergic rhinitis due to pollen: Secondary | ICD-10-CM | POA: Diagnosis not present

## 2018-03-22 DIAGNOSIS — J329 Chronic sinusitis, unspecified: Secondary | ICD-10-CM | POA: Diagnosis not present

## 2018-03-22 NOTE — Patient Instructions (Addendum)
1. Seasonal allergic rhinitis due to pollen - Continue with allergy shots at the same schedule. - Continue with Xyzal 5mg  daily. - Continue with Dymista 1-2 sprays per nostril up to twice daily.  2. Recurrent sinusitis - We will get some repeat Streptococcal titers to make sure that your immune system responded to the vaccination.  - We will call you in 1-2 weeks with the results of the testing  3. Return in about 1 year (around 03/23/2019).   Please inform us of any Emergency Department visits, hospitalizations, or changes in symptoms. Call us before going to the ED for breathing or allergy symptoms since we might be able to fit you in for a sick visit. Feel free to contact us anytime with any questions, problems, or concerns.  It was a pleasure to meet you today!  Websites that have reliable patient information: 1. American Academy of Asthma, Allergy, and Immunology: www.aaaai.org 2. Food Allergy Research and Education (FARE): foodallergy.org 3. Mothers of Asthmatics: http://www.asthmacommunitynetwork.org 4. American College of Allergy, Asthma, and Immunology: MissingWeapons.ca   Make sure you are registered to vote! If you have moved or changed any of your contact information, you will need to get this updated before voting!

## 2018-03-22 NOTE — Progress Notes (Signed)
FOLLOW UP  Date of Service/Encounter:  03/22/18   Assessment:   Seasonal allergic rhinitis due to pollen  Recurrent sinusitis  - with inadequate response to Pneumovax (repeat titers pending)   Plan/Recommendations:   1. Seasonal allergic rhinitis due to pollen - Continue with allergy shots at the same schedule. - Continue with Xyzal 5mg  daily. - Continue with Dymista 1-2 sprays per nostril up to twice daily.  2. Recurrent sinusitis - We will get some repeat Streptococcal titers to make sure that your immune system responded to the vaccination.  - We will call you in 1-2 weeks with the results of the testing  3. Return in about 1 year (around 03/23/2019).   Subjective:   Taylor Collier is a 42 y.o. female presenting today for follow up of  Chief Complaint  Patient presents with  . Sinusitis    Taylor Collier has a history of the following: Patient Active Problem List   Diagnosis Date Noted  . Chronic insomnia 12/24/2014  . Generalized convulsive epilepsy (HCC) 11/16/2013    History obtained from: chart review and patient.  Taylor Collier Primary Care Provider is Burdine, Ananias Pilgrim, MD.     Taylor Collier is a 42 y.o. female presenting for a follow up visit.  She was last seen in July 2019 as a new patient by Dr. Delorse Lek.  At that time, she was evaluated for chronic sinusitis.  Testing was positive to weeds, trees, and one mold.  She has since started allergen immunotherapy.  She was started on Xyzal 5 mg daily as well as Dymista 1 spray per nostril up to twice daily.  She did have a work-up for recurrent infections which demonstrated protection only 2 out of 23 serotypes of Streptococcus pneumonia.  It was recommended that she get a Pneumovax with repeat titers.  Since the last visit, she reports that she has done well.  She is happy with the allergy shots.  She remains on the Dymista 1 spray per nostril twice daily.  She is only paying around $20 per month for the Dymista.   She is on the Xyzal as well.  This combination seems to be doing well.  She has received her Pneumovax, but has not received her post vaccination titers. She has had no breakthrough infections since last visit.  Taylor Collier is on allergen immunotherapy. She receives one injection. Immunotherapy script #1 contains trees and weeds. She currently receives 0.43mL of the GOLD vial (1/10,000). She started shots August of 2019 and not yet reached maintenance.   Otherwise, there have been no changes to her past medical history, surgical history, family history, or social history.  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    Review of Systems: a 14-point review of systems is pertinent for what is mentioned in HPI.  Otherwise, all other systems were negative.  Constitutional: negative other than that listed in the HPI Eyes: negative other than that listed in the HPI Ears, nose, mouth, throat, and face: negative other than that listed in the HPI Respiratory: negative other than that listed in the HPI Cardiovascular: negative other than that listed in the HPI Gastrointestinal: negative other than that listed in the HPI Genitourinary: negative other than that listed in the HPI Integument: negative other than that listed in the HPI Hematologic: negative other than that listed  in the HPI Musculoskeletal: negative other than that listed in the HPI Neurological: negative other than that listed in the HPI Allergy/Immunologic: negative other than that listed in the HPI    Objective:   Blood pressure 120/90, pulse 96, resp. rate 18, SpO2 95 %. There is no height or weight on file to calculate BMI.   Physical Exam:  General: Alert, interactive, in no acute distress. Very talkative female.  Eyes: No conjunctival injection bilaterally, no discharge on the right, no discharge on  the left and no Horner-Trantas dots present. PERRL bilaterally. EOMI without pain. No photophobia.  Ears: Right TM pearly gray with normal light reflex, Left TM pearly gray with normal light reflex, Right TM intact without perforation and Left TM intact without perforation.  Nose/Throat: External nose within normal limits and septum midline. Turbinates edematous and pale with clear discharge. Posterior oropharynx erythematous without cobblestoning in the posterior oropharynx. Tonsils 2+ without exudates.  Tongue without thrush. Lungs: Clear to auscultation without wheezing, rhonchi or rales. No increased work of breathing. CV: Normal S1/S2. No murmurs. Capillary refill <2 seconds.  Skin: Warm and dry, without lesions or rashes. Neuro:   Grossly intact. No focal deficits appreciated. Responsive to questions.  Diagnostic studies: none      Malachi Bonds, MD  [facility]

## 2018-04-03 ENCOUNTER — Ambulatory Visit: Payer: BC Managed Care – PPO | Admitting: Allergy

## 2018-04-12 ENCOUNTER — Ambulatory Visit (INDEPENDENT_AMBULATORY_CARE_PROVIDER_SITE_OTHER): Payer: BC Managed Care – PPO | Admitting: *Deleted

## 2018-04-12 DIAGNOSIS — J309 Allergic rhinitis, unspecified: Secondary | ICD-10-CM

## 2018-04-28 ENCOUNTER — Ambulatory Visit (INDEPENDENT_AMBULATORY_CARE_PROVIDER_SITE_OTHER): Payer: BC Managed Care – PPO | Admitting: *Deleted

## 2018-04-28 DIAGNOSIS — J309 Allergic rhinitis, unspecified: Secondary | ICD-10-CM

## 2018-05-03 ENCOUNTER — Ambulatory Visit (INDEPENDENT_AMBULATORY_CARE_PROVIDER_SITE_OTHER): Payer: BC Managed Care – PPO | Admitting: *Deleted

## 2018-05-03 DIAGNOSIS — J309 Allergic rhinitis, unspecified: Secondary | ICD-10-CM | POA: Diagnosis not present

## 2018-05-10 ENCOUNTER — Ambulatory Visit (INDEPENDENT_AMBULATORY_CARE_PROVIDER_SITE_OTHER): Payer: BC Managed Care – PPO | Admitting: *Deleted

## 2018-05-10 DIAGNOSIS — J309 Allergic rhinitis, unspecified: Secondary | ICD-10-CM

## 2018-05-26 ENCOUNTER — Ambulatory Visit (INDEPENDENT_AMBULATORY_CARE_PROVIDER_SITE_OTHER): Payer: BC Managed Care – PPO

## 2018-05-26 DIAGNOSIS — J309 Allergic rhinitis, unspecified: Secondary | ICD-10-CM

## 2018-06-07 ENCOUNTER — Ambulatory Visit (INDEPENDENT_AMBULATORY_CARE_PROVIDER_SITE_OTHER): Payer: BC Managed Care – PPO

## 2018-06-07 DIAGNOSIS — J309 Allergic rhinitis, unspecified: Secondary | ICD-10-CM

## 2018-06-09 ENCOUNTER — Ambulatory Visit (INDEPENDENT_AMBULATORY_CARE_PROVIDER_SITE_OTHER): Payer: BC Managed Care – PPO

## 2018-06-09 DIAGNOSIS — J309 Allergic rhinitis, unspecified: Secondary | ICD-10-CM | POA: Diagnosis not present

## 2018-06-14 ENCOUNTER — Ambulatory Visit (INDEPENDENT_AMBULATORY_CARE_PROVIDER_SITE_OTHER): Payer: BC Managed Care – PPO | Admitting: *Deleted

## 2018-06-14 DIAGNOSIS — J309 Allergic rhinitis, unspecified: Secondary | ICD-10-CM

## 2018-06-21 ENCOUNTER — Ambulatory Visit (INDEPENDENT_AMBULATORY_CARE_PROVIDER_SITE_OTHER): Payer: BC Managed Care – PPO | Admitting: *Deleted

## 2018-06-21 DIAGNOSIS — J309 Allergic rhinitis, unspecified: Secondary | ICD-10-CM | POA: Diagnosis not present

## 2018-06-28 ENCOUNTER — Ambulatory Visit (INDEPENDENT_AMBULATORY_CARE_PROVIDER_SITE_OTHER): Payer: BC Managed Care – PPO

## 2018-06-28 DIAGNOSIS — J309 Allergic rhinitis, unspecified: Secondary | ICD-10-CM

## 2018-07-12 ENCOUNTER — Ambulatory Visit (INDEPENDENT_AMBULATORY_CARE_PROVIDER_SITE_OTHER): Payer: BC Managed Care – PPO

## 2018-07-12 DIAGNOSIS — J309 Allergic rhinitis, unspecified: Secondary | ICD-10-CM

## 2018-07-19 ENCOUNTER — Ambulatory Visit (INDEPENDENT_AMBULATORY_CARE_PROVIDER_SITE_OTHER): Payer: BC Managed Care – PPO

## 2018-07-19 DIAGNOSIS — J309 Allergic rhinitis, unspecified: Secondary | ICD-10-CM | POA: Diagnosis not present

## 2018-08-02 ENCOUNTER — Ambulatory Visit (INDEPENDENT_AMBULATORY_CARE_PROVIDER_SITE_OTHER): Payer: BC Managed Care – PPO

## 2018-08-02 DIAGNOSIS — J309 Allergic rhinitis, unspecified: Secondary | ICD-10-CM

## 2018-08-11 ENCOUNTER — Ambulatory Visit (INDEPENDENT_AMBULATORY_CARE_PROVIDER_SITE_OTHER): Payer: BC Managed Care – PPO

## 2018-08-11 DIAGNOSIS — J309 Allergic rhinitis, unspecified: Secondary | ICD-10-CM

## 2018-08-16 ENCOUNTER — Ambulatory Visit (INDEPENDENT_AMBULATORY_CARE_PROVIDER_SITE_OTHER): Payer: BC Managed Care – PPO

## 2018-08-16 DIAGNOSIS — J309 Allergic rhinitis, unspecified: Secondary | ICD-10-CM | POA: Diagnosis not present

## 2018-08-25 ENCOUNTER — Ambulatory Visit (INDEPENDENT_AMBULATORY_CARE_PROVIDER_SITE_OTHER): Payer: BC Managed Care – PPO

## 2018-08-25 DIAGNOSIS — J309 Allergic rhinitis, unspecified: Secondary | ICD-10-CM

## 2018-09-06 ENCOUNTER — Ambulatory Visit (INDEPENDENT_AMBULATORY_CARE_PROVIDER_SITE_OTHER): Payer: BC Managed Care – PPO | Admitting: *Deleted

## 2018-09-06 DIAGNOSIS — J309 Allergic rhinitis, unspecified: Secondary | ICD-10-CM

## 2018-09-13 ENCOUNTER — Ambulatory Visit (INDEPENDENT_AMBULATORY_CARE_PROVIDER_SITE_OTHER): Payer: BC Managed Care – PPO

## 2018-09-13 DIAGNOSIS — J309 Allergic rhinitis, unspecified: Secondary | ICD-10-CM

## 2018-09-20 ENCOUNTER — Ambulatory Visit (INDEPENDENT_AMBULATORY_CARE_PROVIDER_SITE_OTHER): Payer: BC Managed Care – PPO | Admitting: *Deleted

## 2018-09-20 DIAGNOSIS — J309 Allergic rhinitis, unspecified: Secondary | ICD-10-CM | POA: Diagnosis not present

## 2018-09-27 ENCOUNTER — Ambulatory Visit (INDEPENDENT_AMBULATORY_CARE_PROVIDER_SITE_OTHER): Payer: BC Managed Care – PPO | Admitting: *Deleted

## 2018-09-27 DIAGNOSIS — J309 Allergic rhinitis, unspecified: Secondary | ICD-10-CM | POA: Diagnosis not present

## 2018-10-04 ENCOUNTER — Ambulatory Visit (INDEPENDENT_AMBULATORY_CARE_PROVIDER_SITE_OTHER): Payer: BC Managed Care – PPO

## 2018-10-04 DIAGNOSIS — J309 Allergic rhinitis, unspecified: Secondary | ICD-10-CM

## 2018-10-11 ENCOUNTER — Ambulatory Visit (INDEPENDENT_AMBULATORY_CARE_PROVIDER_SITE_OTHER): Payer: BC Managed Care – PPO

## 2018-10-11 DIAGNOSIS — J309 Allergic rhinitis, unspecified: Secondary | ICD-10-CM | POA: Diagnosis not present

## 2018-10-20 ENCOUNTER — Ambulatory Visit (INDEPENDENT_AMBULATORY_CARE_PROVIDER_SITE_OTHER): Payer: BC Managed Care – PPO

## 2018-10-20 DIAGNOSIS — J309 Allergic rhinitis, unspecified: Secondary | ICD-10-CM | POA: Diagnosis not present

## 2018-10-27 ENCOUNTER — Ambulatory Visit (INDEPENDENT_AMBULATORY_CARE_PROVIDER_SITE_OTHER): Payer: BC Managed Care – PPO

## 2018-10-27 DIAGNOSIS — J309 Allergic rhinitis, unspecified: Secondary | ICD-10-CM | POA: Diagnosis not present

## 2018-11-01 NOTE — Progress Notes (Signed)
VIAL EXP 11-01-2019 

## 2018-11-02 DIAGNOSIS — J301 Allergic rhinitis due to pollen: Secondary | ICD-10-CM

## 2018-11-03 ENCOUNTER — Ambulatory Visit (INDEPENDENT_AMBULATORY_CARE_PROVIDER_SITE_OTHER): Payer: BC Managed Care – PPO

## 2018-11-03 DIAGNOSIS — J309 Allergic rhinitis, unspecified: Secondary | ICD-10-CM

## 2018-11-15 ENCOUNTER — Ambulatory Visit (INDEPENDENT_AMBULATORY_CARE_PROVIDER_SITE_OTHER): Payer: BC Managed Care – PPO

## 2018-11-15 DIAGNOSIS — J309 Allergic rhinitis, unspecified: Secondary | ICD-10-CM | POA: Diagnosis not present

## 2018-11-29 ENCOUNTER — Other Ambulatory Visit: Payer: Self-pay | Admitting: Neurology

## 2018-12-01 ENCOUNTER — Other Ambulatory Visit: Payer: Self-pay

## 2018-12-01 ENCOUNTER — Ambulatory Visit (INDEPENDENT_AMBULATORY_CARE_PROVIDER_SITE_OTHER): Payer: BC Managed Care – PPO

## 2018-12-01 DIAGNOSIS — J309 Allergic rhinitis, unspecified: Secondary | ICD-10-CM | POA: Diagnosis not present

## 2018-12-08 ENCOUNTER — Ambulatory Visit (INDEPENDENT_AMBULATORY_CARE_PROVIDER_SITE_OTHER): Payer: BC Managed Care – PPO

## 2018-12-08 ENCOUNTER — Other Ambulatory Visit: Payer: Self-pay

## 2018-12-08 DIAGNOSIS — J309 Allergic rhinitis, unspecified: Secondary | ICD-10-CM | POA: Diagnosis not present

## 2018-12-15 ENCOUNTER — Ambulatory Visit (INDEPENDENT_AMBULATORY_CARE_PROVIDER_SITE_OTHER): Payer: BC Managed Care – PPO

## 2018-12-15 ENCOUNTER — Other Ambulatory Visit: Payer: Self-pay

## 2018-12-15 DIAGNOSIS — J309 Allergic rhinitis, unspecified: Secondary | ICD-10-CM | POA: Diagnosis not present

## 2018-12-20 ENCOUNTER — Ambulatory Visit (INDEPENDENT_AMBULATORY_CARE_PROVIDER_SITE_OTHER): Payer: BC Managed Care – PPO

## 2018-12-20 ENCOUNTER — Other Ambulatory Visit: Payer: Self-pay

## 2018-12-20 DIAGNOSIS — J309 Allergic rhinitis, unspecified: Secondary | ICD-10-CM | POA: Diagnosis not present

## 2018-12-25 ENCOUNTER — Other Ambulatory Visit: Payer: Self-pay

## 2018-12-25 ENCOUNTER — Ambulatory Visit: Payer: BC Managed Care – PPO | Admitting: Neurology

## 2018-12-25 ENCOUNTER — Encounter: Payer: Self-pay | Admitting: Neurology

## 2018-12-25 VITALS — BP 142/96 | HR 56 | Temp 98.6°F | Ht 64.0 in | Wt 271.0 lb

## 2018-12-25 DIAGNOSIS — G4489 Other headache syndrome: Secondary | ICD-10-CM

## 2018-12-25 DIAGNOSIS — Z5181 Encounter for therapeutic drug level monitoring: Secondary | ICD-10-CM | POA: Diagnosis not present

## 2018-12-25 DIAGNOSIS — G40309 Generalized idiopathic epilepsy and epileptic syndromes, not intractable, without status epilepticus: Secondary | ICD-10-CM | POA: Diagnosis not present

## 2018-12-25 DIAGNOSIS — G479 Sleep disorder, unspecified: Secondary | ICD-10-CM

## 2018-12-25 MED ORDER — LAMOTRIGINE 200 MG PO TABS: 200.0000 mg | ORAL_TABLET | Freq: Two times a day (BID) | ORAL | 3 refills | Status: DC

## 2018-12-25 MED ORDER — LEVETIRACETAM 500 MG PO TABS: 500.0000 mg | ORAL_TABLET | Freq: Two times a day (BID) | ORAL | 3 refills | Status: DC

## 2018-12-25 MED ORDER — TOPIRAMATE 25 MG PO TABS: ORAL_TABLET | ORAL | 3 refills | Status: DC

## 2018-12-25 NOTE — Patient Instructions (Signed)
We will start Topamax for the headache. Call for any dose adjustments.   Topamax (topiramate) is a seizure medication that has an FDA approval for seizures and for migraine headache. Potential side effects of this medication include weight loss, cognitive slowing, tingling in the fingers and toes, and carbonated drinks will taste bad. If any significant side effects are noted on this drug, please contact our office.

## 2018-12-25 NOTE — Progress Notes (Signed)
Reason for visit: Seizures  Taylor Collier is an 43 y.o. female  History of present illness:  Taylor Collier is a 43 year old right-handed white female with a history of obesity, seizures, and a prior history of sleep apnea.  The patient was told that her sleep apnea was not severe, but the last work-up was 6 or 7 years ago.  The patient has come in with a new problem.  Over the last 3 to 4 months, she has begun having 2-3 headaches a week.  The headaches are usually bifrontal in nature and are oftentimes noted first thing in the morning.  The patient is getting allergy shots, she is not sure whether this is a causal factor or not.  She denies any new medications that been added.  The patient reports no photophobia or phonophobia with a headache and no nausea or vomiting.  She has not had any further seizures, she is on Lamictal and Keppra for this.  She works as a Chartered loss adjusterschoolteacher, she does well with this.  She denies any severe excessive daytime drowsiness but she continues to snore quite a bit.  She has gained only about 6 pounds of weight over the last 6 years.  Past Medical History:  Diagnosis Date  . Anxiety   . Apnea    witnessed with snoring  . Chronic insomnia 12/24/2014  . 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 Hypertension   . Obesity   . OSA (obstructive sleep apnea)   . Seizures (HCC)     Past Surgical History:  Procedure Laterality Date  . birthmark resection    . fallopian tube blockage resection    . OVARIAN CYST REMOVAL      Family History  Problem Relation Age of Onset  . Colon cancer Mother   . Aneurysm Father   . Breast cancer Sister   . Diabetes Paternal Aunt   . Alzheimer's disease Maternal Grandmother   . Uterine cancer Maternal Grandmother     Social history:  reports that she quit smoking about 21 years ago. Her smoking use included cigarettes. She started smoking about 24 years ago. She smoked 0.25 packs per  day. She has never used smokeless tobacco. She reports current drug use. Drug: Psilocybin. She reports that she does not drink alcohol.    Allergies  Allergen Reactions  . Amoxicillin-Pot Clavulanate Rash  . Penicillins Rash    Medications:  Prior to Admission medications   Medication Sig Start Date End Date Taking? Authorizing Provider  acetaminophen (TYLENOL) 500 MG tablet Take 1,000 mg by mouth daily.   Yes [provider]  Azelastine-Fluticasone (DYMISTA) 137-50 MCG/ACT SUSP Place 2 sprays into both nostrils daily.    Yes [provider]  Biotin 5000 MCG TABS Take 1 tablet by mouth daily.   Yes [provider]  CALCIUM PO Take 600 mg by mouth daily.   Yes [provider]  chlorthalidone (HYGROTON) 25 MG tablet TAKE ONE TABLET BY MOUTH EVERY DAY - PT. NEEDS OFFICE VISIT 12/27/16  Yes Branch, Dorothe PeaJonathan F, MD  EPINEPHrine 0.3 mg/0.3 mL IJ SOAJ injection Inject one dose intramuscularly for allergic reaction. May repeat one dose if needed after 5-15 minutes. Proceed to the ER 01/18/18  Yes [provider]  folic acid (FOLVITE) 1 MG tablet TAKE 1 TABLET BY MOUTH TWICE DAILY 12/31/14  Yes York SpanielWillis, Keon Pender K, MD  labetalol (NORMODYNE) 200 MG tablet TAKE 1 TABLET BY MOUTH TWICE DAILY 02/07/18  Yes Arnoldo Lenis, MD  lamoTRIgine (LAMICTAL) 200 MG tablet Take 1 tablet (200 mg total) by mouth 2 (two) times daily. 12/23/17  Yes Kathrynn Ducking, MD  levETIRAcetam (KEPPRA) 500 MG tablet Take 1 tablet (500 mg total) by mouth 2 (two) times daily. 12/23/17  Yes Kathrynn Ducking, MD  Levocetirizine Dihydrochloride Harlow Ohms ALLERGY 24HR PO) Take 10 mg by mouth daily.   Yes [provider]  Multiple Vitamin (MULTIVITAMIN) tablet Take 1 tablet by mouth daily.   Yes [provider]  Multiple Vitamins-Minerals (ECHINACEA ACZ PO) Take 750 mcg by mouth daily.   Yes [provider]  Probiotic Product (PROBIOTIC PO) Take 1 capsule by mouth daily.    Yes [provider]  traZODone (DESYREL) 150 MG tablet TAKE 1 TABLET BY MOUTH AT BEDTIME 11/29/18  Yes Kathrynn Ducking, MD  UNABLE TO FIND Allergy Shot 1 per week   Yes [provider]    ROS:  Out of a complete 14 system review of symptoms, the patient complains only of the following symptoms, and all other reviewed systems are negative.  Headaches Snoring Weight gain  Blood pressure (!) 142/96, pulse (!) 56, temperature 98.6 F (37 C), temperature source Temporal, height 5\' 4"  (1.626 m), weight 271 lb (122.9 kg).  Physical Exam  General: The patient is alert and cooperative at the time of the examination.  The patient is markedly obese.  Skin: No significant peripheral edema is noted.   Neurologic Exam  Mental status: The patient is alert and oriented x 3 at the time of the examination. The patient has apparent normal recent and remote memory, with an apparently normal attention span and concentration ability.   Cranial nerves: Facial symmetry is present. Speech is normal, no aphasia or dysarthria is noted. Extraocular movements are full. Visual fields are full.  Motor: The patient has good strength in all 4 extremities.  Sensory examination: Soft touch sensation is symmetric on the face, arms, and legs.  Coordination: The patient has good finger-nose-finger and heel-to-shin bilaterally.  Gait and station: The patient has a normal gait. Tandem gait is normal. Romberg is negative. No drift is seen.  Reflexes: Deep tendon reflexes are symmetric.   Assessment/Plan:  1.  History of seizures, well controlled  2.  History of sleep apnea  3.  Headaches, new onset  The patient is having new headaches, she denies any pre-existing history of headaches and any other time of her life.  The patient is having early morning headaches frequently, and this therefore may be related to sleep apnea.  The patient will be set up for MRI of the brain, blood work will be  done today.  Topamax will be added in the evening hours working up to 50 mg at night.  I will make another referral for sleep evaluation.  She will follow-up here in 1 year for routine evaluation, she will call for any dose adjustments for her headache medication.  The headaches are not severe and disabling.  Jill Alexanders MD 12/25/2018 10:36 AM  [facility] [address] [place], [place] [address]  Phone [phone] Fax [phone]

## 2018-12-26 ENCOUNTER — Telehealth: Payer: Self-pay | Admitting: Neurology

## 2018-12-26 NOTE — Telephone Encounter (Signed)
pt schedule at [facility] for 12/28/18 arrival time is 3:00 pm LVM for pt to be aware. I left their number of [phone] incase she needed to r/s.  BCBS Auth: [id number] (exp. 12/26/18 to 06/23/18)

## 2018-12-27 LAB — COMPREHENSIVE METABOLIC PANEL
ALT: 26 IU/L (ref 0–32)
AST: 19 IU/L (ref 0–40)
Albumin/Globulin Ratio: 1.9 (ref 1.2–2.2)
Albumin: 4.6 g/dL (ref 3.8–4.8)
Alkaline Phosphatase: 88 IU/L (ref 39–117)
BUN/Creatinine Ratio: 18 (ref 9–23)
BUN: 13 mg/dL (ref 6–24)
Bilirubin Total: 0.4 mg/dL (ref 0.0–1.2)
CO2: 20 mmol/L (ref 20–29)
Calcium: 10.2 mg/dL (ref 8.7–10.2)
Chloride: 98 mmol/L (ref 96–106)
Creatinine, Ser: 0.74 mg/dL (ref 0.57–1.00)
GFR calc Af Amer: 113 mL/min/{1.73_m2} (ref >59)
GFR calc non Af Amer: 98 mL/min/{1.73_m2} (ref >59)
Globulin, Total: 2.4 g/dL (ref 1.5–4.5)
Glucose: 86 mg/dL (ref 65–99)
Potassium: 4.2 mmol/L (ref 3.5–5.2)
Sodium: 136 mmol/L (ref 134–144)
Total Protein: 7 g/dL (ref 6.0–8.5)

## 2018-12-27 LAB — CBC WITH DIFFERENTIAL/PLATELET
Basophils Absolute: 0.1 10*3/uL (ref 0.0–0.2)
Basos: 1 %
EOS (ABSOLUTE): 0 10*3/uL (ref 0.0–0.4)
Eos: 0 %
Hematocrit: 41.2 % (ref 34.0–46.6)
Hemoglobin: 13.6 g/dL (ref 11.1–15.9)
Immature Grans (Abs): 0 10*3/uL (ref 0.0–0.1)
Immature Granulocytes: 0 %
Lymphocytes Absolute: 2 10*3/uL (ref 0.7–3.1)
Lymphs: 24 %
MCH: 29.2 pg (ref 26.6–33.0)
MCHC: 33 g/dL (ref 31.5–35.7)
MCV: 88 fL (ref 79–97)
Monocytes Absolute: 0.6 10*3/uL (ref 0.1–0.9)
Monocytes: 7 %
Neutrophils Absolute: 5.7 10*3/uL (ref 1.4–7.0)
Neutrophils: 68 %
Platelets: 322 10*3/uL (ref 150–450)
RBC: 4.66 x10E6/uL (ref 3.77–5.28)
RDW: 12.8 % (ref 11.7–15.4)
WBC: 8.4 10*3/uL (ref 3.4–10.8)

## 2018-12-27 LAB — LEVETIRACETAM LEVEL: Levetiracetam Lvl: 16 ug/mL (ref 10.0–40.0)

## 2018-12-27 LAB — LAMOTRIGINE LEVEL: Lamotrigine Lvl: 6.6 ug/mL (ref 2.0–20.0)

## 2018-12-29 ENCOUNTER — Other Ambulatory Visit: Payer: Self-pay

## 2018-12-29 ENCOUNTER — Ambulatory Visit (INDEPENDENT_AMBULATORY_CARE_PROVIDER_SITE_OTHER): Payer: BC Managed Care – PPO

## 2018-12-29 DIAGNOSIS — J309 Allergic rhinitis, unspecified: Secondary | ICD-10-CM

## 2019-01-01 ENCOUNTER — Telehealth: Payer: Self-pay

## 2019-01-01 NOTE — Telephone Encounter (Signed)
-----   Message from Kathrynn Ducking, MD sent at 12/27/2018  7:26 AM EDT ----- Blood work is unremarkable, therapeutic levels of Keppra and Lamictal, no change in dosing.  Please call the patient. ----- Message ----- From: Lavone Neri Lab Results In Sent: 12/26/2018   7:37 AM EDT To: Kathrynn Ducking, MD

## 2019-01-01 NOTE — Telephone Encounter (Signed)
Lvm asking for a call back

## 2019-01-02 ENCOUNTER — Telehealth: Payer: Self-pay | Admitting: Neurology

## 2019-01-02 NOTE — Telephone Encounter (Signed)
Reached out to the pt and was unable to connect or lvm. Will try again at a later time.

## 2019-01-02 NOTE — Telephone Encounter (Signed)
I called the patient.  MRI shows some white matter changes, the patient does have a history of hypertension and is on several medications for this.  She may need to go on low-dose aspirin, be careful about monitoring her blood pressures at home.  Sinuses are clear, no source of headache seen.   MRI brain 12/28/18:  Impression:  1.  No acute intracranial abnormality. 2.  Increased number of numerous foci of hyperintense T2 weighted signal throughout the bilateral hemispheric white matter.  Sinuses appear to be clear.

## 2019-01-04 NOTE — Telephone Encounter (Signed)
I reached out to the pt and left results on vm. Pt was advised of [facility] hours and number if she needed to call back.

## 2019-01-09 ENCOUNTER — Other Ambulatory Visit: Payer: Self-pay | Admitting: Allergy

## 2019-01-26 ENCOUNTER — Ambulatory Visit (INDEPENDENT_AMBULATORY_CARE_PROVIDER_SITE_OTHER): Payer: BC Managed Care – PPO

## 2019-01-26 DIAGNOSIS — J309 Allergic rhinitis, unspecified: Secondary | ICD-10-CM | POA: Diagnosis not present

## 2019-02-07 ENCOUNTER — Ambulatory Visit (INDEPENDENT_AMBULATORY_CARE_PROVIDER_SITE_OTHER): Payer: BC Managed Care – PPO | Admitting: *Deleted

## 2019-02-07 ENCOUNTER — Telehealth: Payer: Self-pay | Admitting: *Deleted

## 2019-02-07 DIAGNOSIS — J309 Allergic rhinitis, unspecified: Secondary | ICD-10-CM

## 2019-02-07 DIAGNOSIS — J329 Chronic sinusitis, unspecified: Secondary | ICD-10-CM

## 2019-02-07 NOTE — Addendum Note (Signed)
Addended by: Valentina Shaggy on: 02/07/2019 11:09 PM   Modules accepted: Orders

## 2019-02-07 NOTE — Telephone Encounter (Signed)
Patient came in for allergy shot today and stated that she would like to get the pneumovax shot since she was not immune to all strands. Patient received the pneumovax last year in but never did repeat titers. Please advise.

## 2019-02-07 NOTE — Telephone Encounter (Signed)
I reordered the Streptococcal pneumoniae titers. She can go to [facility] to get those done.   Salvatore Marvel, MD [facility]

## 2019-02-08 NOTE — Telephone Encounter (Signed)
Called patient and left a voicemail asking to return call to discuss.  

## 2019-02-14 ENCOUNTER — Ambulatory Visit (INDEPENDENT_AMBULATORY_CARE_PROVIDER_SITE_OTHER): Payer: BC Managed Care – PPO

## 2019-02-14 DIAGNOSIS — J309 Allergic rhinitis, unspecified: Secondary | ICD-10-CM

## 2019-02-14 NOTE — Telephone Encounter (Signed)
Called patient and informed. Patient verbalized understanding.  

## 2019-02-28 ENCOUNTER — Other Ambulatory Visit: Payer: Self-pay | Admitting: Allergy & Immunology

## 2019-02-28 ENCOUNTER — Ambulatory Visit (INDEPENDENT_AMBULATORY_CARE_PROVIDER_SITE_OTHER): Payer: BC Managed Care – PPO

## 2019-02-28 DIAGNOSIS — J309 Allergic rhinitis, unspecified: Secondary | ICD-10-CM | POA: Diagnosis not present

## 2019-03-09 ENCOUNTER — Ambulatory Visit (INDEPENDENT_AMBULATORY_CARE_PROVIDER_SITE_OTHER): Payer: BC Managed Care – PPO

## 2019-03-09 DIAGNOSIS — J309 Allergic rhinitis, unspecified: Secondary | ICD-10-CM | POA: Diagnosis not present

## 2019-03-21 ENCOUNTER — Ambulatory Visit (INDEPENDENT_AMBULATORY_CARE_PROVIDER_SITE_OTHER): Payer: BC Managed Care – PPO | Admitting: *Deleted

## 2019-03-21 DIAGNOSIS — J309 Allergic rhinitis, unspecified: Secondary | ICD-10-CM

## 2019-04-04 ENCOUNTER — Ambulatory Visit (INDEPENDENT_AMBULATORY_CARE_PROVIDER_SITE_OTHER): Payer: BC Managed Care – PPO

## 2019-04-04 DIAGNOSIS — J309 Allergic rhinitis, unspecified: Secondary | ICD-10-CM

## 2019-04-09 DIAGNOSIS — J301 Allergic rhinitis due to pollen: Secondary | ICD-10-CM

## 2019-04-09 NOTE — Progress Notes (Signed)
VIAL  EXP 04-08-20

## 2019-04-13 ENCOUNTER — Ambulatory Visit (INDEPENDENT_AMBULATORY_CARE_PROVIDER_SITE_OTHER): Payer: BC Managed Care – PPO

## 2019-04-13 DIAGNOSIS — J309 Allergic rhinitis, unspecified: Secondary | ICD-10-CM | POA: Diagnosis not present

## 2019-04-18 ENCOUNTER — Ambulatory Visit (INDEPENDENT_AMBULATORY_CARE_PROVIDER_SITE_OTHER): Payer: BC Managed Care – PPO

## 2019-04-18 DIAGNOSIS — J309 Allergic rhinitis, unspecified: Secondary | ICD-10-CM | POA: Diagnosis not present

## 2019-04-30 ENCOUNTER — Other Ambulatory Visit: Payer: Self-pay | Admitting: Neurology

## 2019-05-11 ENCOUNTER — Ambulatory Visit (INDEPENDENT_AMBULATORY_CARE_PROVIDER_SITE_OTHER): Payer: BC Managed Care – PPO

## 2019-05-11 DIAGNOSIS — J309 Allergic rhinitis, unspecified: Secondary | ICD-10-CM

## 2019-05-16 ENCOUNTER — Ambulatory Visit (INDEPENDENT_AMBULATORY_CARE_PROVIDER_SITE_OTHER): Payer: BC Managed Care – PPO

## 2019-05-16 DIAGNOSIS — J309 Allergic rhinitis, unspecified: Secondary | ICD-10-CM | POA: Diagnosis not present

## 2019-05-30 ENCOUNTER — Ambulatory Visit (INDEPENDENT_AMBULATORY_CARE_PROVIDER_SITE_OTHER): Payer: BC Managed Care – PPO

## 2019-05-30 DIAGNOSIS — J309 Allergic rhinitis, unspecified: Secondary | ICD-10-CM

## 2019-06-02 ENCOUNTER — Telehealth: Payer: Self-pay | Admitting: Neurology

## 2019-06-02 NOTE — Telephone Encounter (Signed)
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  I will call her back and see back in contact her in regards to what is going on and what needs to be in the letter.

## 2019-06-03 ENCOUNTER — Encounter: Payer: Self-pay | Admitting: Neurology

## 2019-06-03 NOTE — Telephone Encounter (Signed)
I called the patient.  The patient has struggled in getting a masters degree in [employer], her GPA has dipped below 3.0, at 2.8.  She currently is on 3 anticonvulsant medications, the most recent medication added was Topamax for migraine in August 2020.  She remains on Lamictal and Keppra, all of these medications may have cognitive side effects, particularly the Topamax.  I will dictate a letter documenting this issue.  The patient will pick up the letter.

## 2019-06-04 ENCOUNTER — Telehealth: Payer: Self-pay | Admitting: Allergy

## 2019-06-04 NOTE — Telephone Encounter (Signed)
Called patient to schedule office visit due to insurance purposes for shots. Unable to leave message, phone did not ring.

## 2019-06-04 NOTE — Telephone Encounter (Signed)
Signed letter placed up front for pick up 

## 2019-06-08 ENCOUNTER — Ambulatory Visit (INDEPENDENT_AMBULATORY_CARE_PROVIDER_SITE_OTHER): Payer: BC Managed Care – PPO

## 2019-06-08 DIAGNOSIS — J309 Allergic rhinitis, unspecified: Secondary | ICD-10-CM

## 2019-06-20 ENCOUNTER — Ambulatory Visit (INDEPENDENT_AMBULATORY_CARE_PROVIDER_SITE_OTHER): Payer: BC Managed Care – PPO

## 2019-06-20 DIAGNOSIS — J309 Allergic rhinitis, unspecified: Secondary | ICD-10-CM

## 2019-07-06 ENCOUNTER — Ambulatory Visit (INDEPENDENT_AMBULATORY_CARE_PROVIDER_SITE_OTHER): Payer: BC Managed Care – PPO

## 2019-07-06 DIAGNOSIS — J309 Allergic rhinitis, unspecified: Secondary | ICD-10-CM | POA: Diagnosis not present

## 2019-07-18 ENCOUNTER — Ambulatory Visit (INDEPENDENT_AMBULATORY_CARE_PROVIDER_SITE_OTHER): Payer: BC Managed Care – PPO

## 2019-07-18 DIAGNOSIS — J309 Allergic rhinitis, unspecified: Secondary | ICD-10-CM

## 2019-07-29 ENCOUNTER — Ambulatory Visit: Payer: BC Managed Care – PPO | Attending: Internal Medicine

## 2019-07-29 DIAGNOSIS — Z23 Encounter for immunization: Secondary | ICD-10-CM | POA: Insufficient documentation

## 2019-07-29 NOTE — Progress Notes (Signed)
   Covid-19 Vaccination Clinic  Name:  Taylor Collier    MRN: [id number] DOB: [birth year]  07/29/2019  Taylor Collier was observed post Covid-19 immunization for 15 minutes without incident. She was provided with Vaccine Information Sheet and instruction to access the V-Safe system.   Taylor Collier was instructed to call 911 with any severe reactions post vaccine: Marland Kitchen Difficulty breathing  . Swelling of face and throat  . A fast heartbeat  . A bad rash all over body  . Dizziness and weakness   Immunizations Administered    Name Date Dose VIS Date Route   Pfizer COVID-19 Vaccine 07/29/2019  3:08 PM 0.3 mL 05/04/2019 Intramuscular   Manufacturer: [employer], [employer]   Lot: [id number]   NDC: [address]-8

## 2019-08-01 ENCOUNTER — Other Ambulatory Visit: Payer: Self-pay | Admitting: Allergy & Immunology

## 2019-08-01 ENCOUNTER — Ambulatory Visit (INDEPENDENT_AMBULATORY_CARE_PROVIDER_SITE_OTHER): Payer: BC Managed Care – PPO

## 2019-08-01 DIAGNOSIS — J309 Allergic rhinitis, unspecified: Secondary | ICD-10-CM

## 2019-08-08 ENCOUNTER — Ambulatory Visit (INDEPENDENT_AMBULATORY_CARE_PROVIDER_SITE_OTHER): Payer: BC Managed Care – PPO

## 2019-08-08 DIAGNOSIS — J309 Allergic rhinitis, unspecified: Secondary | ICD-10-CM | POA: Diagnosis not present

## 2019-08-15 ENCOUNTER — Ambulatory Visit (INDEPENDENT_AMBULATORY_CARE_PROVIDER_SITE_OTHER): Payer: BC Managed Care – PPO

## 2019-08-15 DIAGNOSIS — J309 Allergic rhinitis, unspecified: Secondary | ICD-10-CM

## 2019-08-19 ENCOUNTER — Ambulatory Visit: Payer: BC Managed Care – PPO | Attending: Internal Medicine

## 2019-08-19 DIAGNOSIS — Z23 Encounter for immunization: Secondary | ICD-10-CM

## 2019-08-19 NOTE — Progress Notes (Signed)
   Covid-19 Vaccination Clinic  Name:  Taylor Collier    MRN: [id number] DOB: [birth year]  08/19/2019  Ms. Alderman was observed post Covid-19 immunization for 15 minutes without incident. She was provided with Vaccine Information Sheet and instruction to access the V-Safe system.   Ms. Herrin was instructed to call 911 with any severe reactions post vaccine: Marland Kitchen Difficulty breathing  . Swelling of face and throat  . A fast heartbeat  . A bad rash all over body  . Dizziness and weakness   Immunizations Administered    Name Date Dose VIS Date Route   Pfizer COVID-19 Vaccine 08/19/2019 10:27 AM 0.3 mL 05/04/2019 Intramuscular   Manufacturer: [employer], [employer]   Lot: [id number]   NDC: [address]-5

## 2019-08-22 ENCOUNTER — Ambulatory Visit (INDEPENDENT_AMBULATORY_CARE_PROVIDER_SITE_OTHER): Payer: BC Managed Care – PPO

## 2019-08-22 DIAGNOSIS — J309 Allergic rhinitis, unspecified: Secondary | ICD-10-CM

## 2019-09-05 ENCOUNTER — Ambulatory Visit (INDEPENDENT_AMBULATORY_CARE_PROVIDER_SITE_OTHER): Payer: BC Managed Care – PPO

## 2019-09-05 DIAGNOSIS — J309 Allergic rhinitis, unspecified: Secondary | ICD-10-CM | POA: Diagnosis not present

## 2019-09-06 ENCOUNTER — Other Ambulatory Visit: Payer: Self-pay | Admitting: Neurology

## 2019-09-10 DIAGNOSIS — J301 Allergic rhinitis due to pollen: Secondary | ICD-10-CM

## 2019-09-10 NOTE — Progress Notes (Signed)
EXP 09/09/20 

## 2019-09-21 ENCOUNTER — Ambulatory Visit (INDEPENDENT_AMBULATORY_CARE_PROVIDER_SITE_OTHER): Payer: BC Managed Care – PPO

## 2019-09-21 DIAGNOSIS — J309 Allergic rhinitis, unspecified: Secondary | ICD-10-CM

## 2019-10-03 ENCOUNTER — Ambulatory Visit (INDEPENDENT_AMBULATORY_CARE_PROVIDER_SITE_OTHER): Payer: BC Managed Care – PPO

## 2019-10-03 ENCOUNTER — Other Ambulatory Visit: Payer: Self-pay | Admitting: Allergy & Immunology

## 2019-10-03 DIAGNOSIS — J309 Allergic rhinitis, unspecified: Secondary | ICD-10-CM | POA: Diagnosis not present

## 2019-11-07 ENCOUNTER — Ambulatory Visit (INDEPENDENT_AMBULATORY_CARE_PROVIDER_SITE_OTHER): Payer: BC Managed Care – PPO

## 2019-11-07 DIAGNOSIS — J309 Allergic rhinitis, unspecified: Secondary | ICD-10-CM

## 2019-11-14 ENCOUNTER — Ambulatory Visit (INDEPENDENT_AMBULATORY_CARE_PROVIDER_SITE_OTHER): Payer: BC Managed Care – PPO

## 2019-11-14 DIAGNOSIS — J309 Allergic rhinitis, unspecified: Secondary | ICD-10-CM | POA: Diagnosis not present

## 2019-11-21 ENCOUNTER — Ambulatory Visit (INDEPENDENT_AMBULATORY_CARE_PROVIDER_SITE_OTHER): Payer: BC Managed Care – PPO

## 2019-11-21 DIAGNOSIS — J309 Allergic rhinitis, unspecified: Secondary | ICD-10-CM | POA: Diagnosis not present

## 2019-11-25 ENCOUNTER — Other Ambulatory Visit: Payer: Self-pay | Admitting: Neurology

## 2019-11-28 ENCOUNTER — Ambulatory Visit (INDEPENDENT_AMBULATORY_CARE_PROVIDER_SITE_OTHER): Payer: BC Managed Care – PPO

## 2019-11-28 DIAGNOSIS — J309 Allergic rhinitis, unspecified: Secondary | ICD-10-CM | POA: Diagnosis not present

## 2019-12-12 ENCOUNTER — Ambulatory Visit (INDEPENDENT_AMBULATORY_CARE_PROVIDER_SITE_OTHER): Payer: BC Managed Care – PPO

## 2019-12-12 DIAGNOSIS — J309 Allergic rhinitis, unspecified: Secondary | ICD-10-CM

## 2019-12-19 ENCOUNTER — Ambulatory Visit (INDEPENDENT_AMBULATORY_CARE_PROVIDER_SITE_OTHER): Payer: BC Managed Care – PPO

## 2019-12-19 DIAGNOSIS — J309 Allergic rhinitis, unspecified: Secondary | ICD-10-CM

## 2019-12-25 ENCOUNTER — Ambulatory Visit: Payer: BC Managed Care – PPO | Admitting: Neurology

## 2019-12-26 ENCOUNTER — Ambulatory Visit (INDEPENDENT_AMBULATORY_CARE_PROVIDER_SITE_OTHER): Payer: BC Managed Care – PPO

## 2019-12-26 ENCOUNTER — Encounter: Payer: Self-pay | Admitting: Neurology

## 2019-12-26 ENCOUNTER — Ambulatory Visit: Payer: BC Managed Care – PPO | Admitting: Neurology

## 2019-12-26 ENCOUNTER — Other Ambulatory Visit: Payer: Self-pay | Admitting: Neurology

## 2019-12-26 VITALS — BP 122/86 | HR 72 | Ht 64.0 in | Wt 257.0 lb

## 2019-12-26 DIAGNOSIS — G43019 Migraine without aura, intractable, without status migrainosus: Secondary | ICD-10-CM | POA: Diagnosis not present

## 2019-12-26 DIAGNOSIS — J309 Allergic rhinitis, unspecified: Secondary | ICD-10-CM | POA: Diagnosis not present

## 2019-12-26 DIAGNOSIS — G40309 Generalized idiopathic epilepsy and epileptic syndromes, not intractable, without status epilepticus: Secondary | ICD-10-CM | POA: Diagnosis not present

## 2019-12-26 DIAGNOSIS — Z5181 Encounter for therapeutic drug level monitoring: Secondary | ICD-10-CM

## 2019-12-26 HISTORY — DX: Migraine without aura, intractable, without status migrainosus: G43.019

## 2019-12-26 MED ORDER — LAMOTRIGINE 200 MG PO TABS: 200.0000 mg | ORAL_TABLET | Freq: Two times a day (BID) | ORAL | 3 refills | Status: DC

## 2019-12-26 MED ORDER — LEVETIRACETAM 500 MG PO TABS: 500.0000 mg | ORAL_TABLET | Freq: Two times a day (BID) | ORAL | 3 refills | Status: DC

## 2019-12-26 MED ORDER — TOPIRAMATE 25 MG PO TABS: 50.0000 mg | ORAL_TABLET | Freq: Every day | ORAL | 3 refills | Status: DC

## 2019-12-26 NOTE — Progress Notes (Signed)
Reason for visit: Migraine headache, seizures  Taylor Collier is an 44 y.o. female  History of present illness:  Taylor Collier is a 44 year old right-handed white female with a history of seizures, and history of migraine headache. The patient has had ongoing headaches, she is having about one headache a week but the headaches are easy to control for her. She claims that she is sleeping fairly well, she is on trazodone at night. She has not had any recurrent seizures. She is on Keppra and Lamictal, she takes low-dose Topamax as well. She reports no other new medical issues that have come up since last seen. Overall, she is functioning well, she is operating a motor vehicle.  Past Medical History:  Diagnosis Date  . Anxiety   . Apnea    witnessed with snoring  . Chronic insomnia 12/24/2014  . 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 Hypertension   . Obesity   . OSA (obstructive sleep apnea)   . Seizures (HCC)     Past Surgical History:  Procedure Laterality Date  . birthmark resection    . fallopian tube blockage resection    . OVARIAN CYST REMOVAL      Family History  Problem Relation Age of Onset  . Colon cancer Mother   . Aneurysm Father   . Breast cancer Sister   . Diabetes Paternal Aunt   . Alzheimer's disease Maternal Grandmother   . Uterine cancer Maternal Grandmother     Social history:  reports that she quit smoking about 22 years ago. Her smoking use included cigarettes. She started smoking about 25 years ago. She smoked 0.25 packs per day. She has never used smokeless tobacco. She reports current drug use. Drug: Psilocybin. She reports that she does not drink alcohol.    Allergies  Allergen Reactions  . Amoxicillin-Pot Clavulanate Rash  . Penicillins Rash    Medications:  Prior to Admission medications   Medication Sig Start Date End Date Taking? Authorizing Provider  acetaminophen (TYLENOL) 500 MG tablet Take  1,000 mg by mouth daily.   Yes [provider]  Biotin 5000 MCG TABS Take 1 tablet by mouth daily.   Yes [provider]  CALCIUM PO Take 600 mg by mouth daily.   Yes [provider]  chlorthalidone (HYGROTON) 25 MG tablet TAKE ONE TABLET BY MOUTH EVERY DAY - PT. NEEDS OFFICE VISIT 12/27/16  Yes Antoine Poche, MD  DYMISTA [phone] MCG/ACT SUSP INSTILL TWO SPRAYS INTO BOTH NOSTRILS DAILY 08/01/19  Yes Alfonse Spruce, MD  EPINEPHrine 0.3 mg/0.3 mL IJ SOAJ injection Inject one dose intramuscularly for allergic reaction. May repeat one dose if needed after 5-15 minutes. Proceed to the ER 01/18/18  Yes [provider]  folic acid (FOLVITE) 1 MG tablet TAKE 1 TABLET BY MOUTH TWICE DAILY 12/31/14  Yes York Spaniel, MD  labetalol (NORMODYNE) 200 MG tablet TAKE 1 TABLET BY MOUTH TWICE DAILY 02/07/18  Yes Branch, Dorothe Pea, MD  lamoTRIgine (LAMICTAL) 200 MG tablet Take 1 tablet (200 mg total) by mouth 2 (two) times daily. 12/25/18  Yes York Spaniel, MD  levETIRAcetam (KEPPRA) 500 MG tablet Take 1 tablet (500 mg total) by mouth 2 (two) times daily. 12/25/18  Yes York Spaniel, MD  Levocetirizine Dihydrochloride Elita Boone ALLERGY 24HR PO) Take 10 mg by mouth daily.   Yes [provider]  Multiple Vitamin (MULTIVITAMIN) tablet Take 1 tablet by mouth daily.   Yes  [provider]  Multiple Vitamins-Minerals (ECHINACEA ACZ PO) Take 750 mcg by mouth daily.   Yes [provider]  Probiotic Product (PROBIOTIC PO) Take 1 capsule by mouth daily.   Yes [provider]  topiramate (TOPAMAX) 25 MG tablet take TWO TABLETS BY MOUTH AT night 09/06/19  Yes York Spaniel, MD  traZODone (DESYREL) 150 MG tablet TAKE 1 TABLET BY MOUTH AT BEDTIME 11/27/19  Yes York Spaniel, MD  UNABLE TO FIND Allergy Shot 1 per week   Yes [provider]    ROS:  Out of a complete 14 system review of symptoms, the patient complains only of the  following symptoms, and all other reviewed systems are negative.  Headache  Blood pressure 122/86, pulse 72, height 5\' 4"  (1.626 m), weight 257 lb (116.6 kg), SpO2 97 %.  Physical Exam  General: The patient is alert and cooperative at the time of the examination. The patient is moderately to markedly obese.  Skin: No significant peripheral edema is noted.   Neurologic Exam  Mental status: The patient is alert and oriented x 3 at the time of the examination. The patient has apparent normal recent and remote memory, with an apparently normal attention span and concentration ability.   Cranial nerves: Facial symmetry is present. Speech is normal, no aphasia or dysarthria is noted. Extraocular movements are full. Visual fields are full.  Motor: The patient has good strength in all 4 extremities.  Sensory examination: Soft touch sensation is symmetric on the face, arms, and legs.  Coordination: The patient has good finger-nose-finger and heel-to-shin bilaterally.  Gait and station: The patient has a normal gait. Tandem gait is normal. Romberg is negative. No drift is seen.  Reflexes: Deep tendon reflexes are symmetric.   MRI brain 12/28/18:  Impression:  1.  No acute intracranial abnormality. 2.  Increased number of numerous foci of hyperintense T2 weighted signal throughout the bilateral hemispheric white matter.  Sinuses appear to be clear.    Assessment/Plan:  1. Migraine headache  2. Seizures  The patient is doing fairly well at this time, we will continue the above medications, prescriptions were sent in. We will check blood work today. She will follow-up here in 1 year, sooner if needed.  02/27/19 MD 12/26/2019 9:45 AM  [facility] [address] [place], [place]  Phone [phone] Fax [phone]

## 2019-12-29 LAB — COMPREHENSIVE METABOLIC PANEL
ALT: 27 IU/L (ref 0–32)
AST: 20 IU/L (ref 0–40)
Albumin/Globulin Ratio: 1.8 (ref 1.2–2.2)
Albumin: 4.5 g/dL (ref 3.8–4.8)
Alkaline Phosphatase: 105 IU/L (ref 48–121)
BUN/Creatinine Ratio: 16 (ref 9–23)
BUN: 12 mg/dL (ref 6–24)
Bilirubin Total: 0.3 mg/dL (ref 0.0–1.2)
CO2: 22 mmol/L (ref 20–29)
Calcium: 9.7 mg/dL (ref 8.7–10.2)
Chloride: 98 mmol/L (ref 96–106)
Creatinine, Ser: 0.75 mg/dL (ref 0.57–1.00)
GFR calc Af Amer: 111 mL/min/{1.73_m2} (ref >59)
GFR calc non Af Amer: 96 mL/min/{1.73_m2} (ref >59)
Globulin, Total: 2.5 g/dL (ref 1.5–4.5)
Glucose: 72 mg/dL (ref 65–99)
Potassium: 3.5 mmol/L (ref 3.5–5.2)
Sodium: 138 mmol/L (ref 134–144)
Total Protein: 7 g/dL (ref 6.0–8.5)

## 2019-12-29 LAB — CBC WITH DIFFERENTIAL/PLATELET
Basophils Absolute: 0.1 10*3/uL (ref 0.0–0.2)
Basos: 1 %
EOS (ABSOLUTE): 0 10*3/uL (ref 0.0–0.4)
Eos: 0 %
Hematocrit: 40.2 % (ref 34.0–46.6)
Hemoglobin: 13.7 g/dL (ref 11.1–15.9)
Immature Grans (Abs): 0 10*3/uL (ref 0.0–0.1)
Immature Granulocytes: 0 %
Lymphocytes Absolute: 2.4 10*3/uL (ref 0.7–3.1)
Lymphs: 26 %
MCH: 29.9 pg (ref 26.6–33.0)
MCHC: 34.1 g/dL (ref 31.5–35.7)
MCV: 88 fL (ref 79–97)
Monocytes Absolute: 0.6 10*3/uL (ref 0.1–0.9)
Monocytes: 6 %
Neutrophils Absolute: 6.2 10*3/uL (ref 1.4–7.0)
Neutrophils: 67 %
Platelets: 311 10*3/uL (ref 150–450)
RBC: 4.58 x10E6/uL (ref 3.77–5.28)
RDW: 12.6 % (ref 11.7–15.4)
WBC: 9.3 10*3/uL (ref 3.4–10.8)

## 2019-12-29 LAB — LEVETIRACETAM LEVEL: Levetiracetam Lvl: 7 ug/mL — ABNORMAL LOW (ref 10.0–40.0)

## 2019-12-29 LAB — LAMOTRIGINE LEVEL: Lamotrigine Lvl: 5.4 ug/mL (ref 2.0–20.0)

## 2020-01-09 ENCOUNTER — Ambulatory Visit (INDEPENDENT_AMBULATORY_CARE_PROVIDER_SITE_OTHER): Payer: BC Managed Care – PPO

## 2020-01-09 DIAGNOSIS — J309 Allergic rhinitis, unspecified: Secondary | ICD-10-CM | POA: Diagnosis not present

## 2020-01-16 ENCOUNTER — Ambulatory Visit (INDEPENDENT_AMBULATORY_CARE_PROVIDER_SITE_OTHER): Payer: BC Managed Care – PPO

## 2020-01-16 DIAGNOSIS — J309 Allergic rhinitis, unspecified: Secondary | ICD-10-CM | POA: Diagnosis not present

## 2020-02-06 ENCOUNTER — Ambulatory Visit (INDEPENDENT_AMBULATORY_CARE_PROVIDER_SITE_OTHER): Payer: BC Managed Care – PPO

## 2020-02-06 DIAGNOSIS — J309 Allergic rhinitis, unspecified: Secondary | ICD-10-CM

## 2020-02-20 ENCOUNTER — Ambulatory Visit (INDEPENDENT_AMBULATORY_CARE_PROVIDER_SITE_OTHER): Payer: BC Managed Care – PPO

## 2020-02-20 DIAGNOSIS — J309 Allergic rhinitis, unspecified: Secondary | ICD-10-CM | POA: Diagnosis not present

## 2020-02-28 DIAGNOSIS — J301 Allergic rhinitis due to pollen: Secondary | ICD-10-CM

## 2020-02-28 NOTE — Progress Notes (Signed)
VIAL EXP 02-27-21

## 2020-03-14 ENCOUNTER — Ambulatory Visit (INDEPENDENT_AMBULATORY_CARE_PROVIDER_SITE_OTHER): Payer: BC Managed Care – PPO

## 2020-03-14 DIAGNOSIS — J309 Allergic rhinitis, unspecified: Secondary | ICD-10-CM

## 2020-03-26 ENCOUNTER — Other Ambulatory Visit: Payer: Self-pay

## 2020-03-26 ENCOUNTER — Ambulatory Visit: Payer: BC Managed Care – PPO | Admitting: Allergy & Immunology

## 2020-03-26 ENCOUNTER — Encounter: Payer: Self-pay | Admitting: Allergy & Immunology

## 2020-03-26 VITALS — BP 140/98 | HR 87 | Temp 98.7°F | Resp 17

## 2020-03-26 DIAGNOSIS — J3089 Other allergic rhinitis: Secondary | ICD-10-CM | POA: Diagnosis not present

## 2020-03-26 DIAGNOSIS — J309 Allergic rhinitis, unspecified: Secondary | ICD-10-CM | POA: Diagnosis not present

## 2020-03-26 DIAGNOSIS — J302 Other seasonal allergic rhinitis: Secondary | ICD-10-CM

## 2020-03-26 NOTE — Patient Instructions (Addendum)
1. Seasonal and perennial allergic rhinitis - Continue with allergy shots at the same schedule. - Continue with the as needed use of your medications.  - Continue with Xyzal 5mg  daily daily.   - EpiPen renewed.  - We will plan to stop in April 2023 (three years) or April 2025 (full five years).  2. Return in about 1 year (around 03/26/2021).    Please inform 13/07/2020 of any Emergency Department visits, hospitalizations, or changes in symptoms. Call us before going to the ED for breathing or allergy symptoms since we might be able to fit you in for a sick visit. Feel free to contact us anytime with any questions, problems, or concerns.  It was a pleasure to see you again today!  Websites that have reliable patient information: 1. American Academy of Asthma, Allergy, and Immunology: www.aaaai.org 2. Food Allergy Research and Education (FARE): foodallergy.org 3. Mothers of Asthmatics: http://www.asthmacommunitynetwork.org 4. American College of Allergy, Asthma, and Immunology: www.acaai.org   COVID-19 Vaccine Information can be found at: [place] For questions related to vaccine distribution or appointments, please email [email or web address] or call [phone].     "Like" [phone] on Facebook and Instagram for our latest updates!     HAPPY FALL!     Make sure you are registered to vote! If you have moved or changed any of your contact information, you will need to get this updated before voting!  In some cases, you MAY be able to register to vote online: [place]

## 2020-03-26 NOTE — Progress Notes (Signed)
FOLLOW UP  Date of Service/Encounter:  03/27/20   Assessment:   Seasonal allergic rhinitis due to pollen  Recurrent sinusitis  - with inadequate response to Pneumovax but improvement in infection frequency since starting allergy shots  Plan/Recommendations:   1. Seasonal and perennial allergic rhinitis - Continue with allergy shots at the same schedule. - Continue with the as needed use of your medications.  - Continue with Xyzal 5mg  daily daily.   - EpiPen renewed.  - We will plan to stop in April 2023 (three years) or April 2025 (full five years).  2. Return in about 1 year (around 03/26/2021).   Subjective:   Taylor Collier is a 44 y.o. female presenting today for follow up of  Chief Complaint  Patient presents with  . Allergic Rhinitis     Taylor Collier has a history of the following: Patient Active Problem List   Diagnosis Date Noted  . Common migraine with intractable migraine 12/26/2019  . Chronic insomnia 12/24/2014  . Generalized convulsive epilepsy (HCC) 11/16/2013    History obtained from: chart review and patient.  Taylor Collier is a 44 y.o. female presenting for a follow up visit. She was last seen in October 2019. At that time, we continued with her allergy shots at the same schedule as well as Xyzal 5 mg daily and Dymista per nostril up to twice daily. For her sinusitis, we obtained repeat streptococcal titers.   In the interim, she seems to have done well. She has not been seen in around 2 years.  Taylor Collier is on allergen immunotherapy. She receives one injection. Immunotherapy script #1 contains trees and weeds. She currently receives 0.21mL of the RED vial (1/100). She started shots August of 2019 and reached maintenance in April 2020.   She notices that she has sneezing fits after eating. This does not matter with what she eats. She does not have a runny nose at all.   Her infections have been much better controlled since starting the allergy shots. She  does not remember the last time that she needed antibiotics at all.   Otherwise, there have been no changes to her past medical history, surgical history, family history, or social history.    Review of Systems  Constitutional: Negative.  Negative for chills, fever, malaise/fatigue and weight loss.  HENT: Negative for congestion, ear discharge, ear pain and sinus pain.   Eyes: Negative for pain, discharge and redness.  Respiratory: Negative for cough, sputum production, shortness of breath and wheezing.   Cardiovascular: Negative.  Negative for chest pain and palpitations.  Gastrointestinal: Negative for abdominal pain, constipation, diarrhea, heartburn, nausea and vomiting.  Skin: Negative.  Negative for itching and rash.  Neurological: Negative for dizziness and headaches.  Endo/Heme/Allergies: Positive for environmental allergies. Does not bruise/bleed easily.       Objective:   Blood pressure (!) 140/98, pulse 87, temperature 98.7 F (37.1 C), resp. rate 17, SpO2 95 %. There is no height or weight on file to calculate BMI.   Physical Exam:  Physical Exam Constitutional:      Appearance: She is well-developed.  HENT:     Head: Normocephalic and atraumatic.     Right Ear: Tympanic membrane, ear canal and external ear normal.     Left Ear: Tympanic membrane and ear canal normal.     Nose: No nasal deformity, septal deviation, mucosal edema or rhinorrhea.     Right Sinus: No maxillary sinus tenderness or frontal sinus tenderness.  Left Sinus: No maxillary sinus tenderness or frontal sinus tenderness.     Mouth/Throat:     Mouth: Mucous membranes are not pale and not dry.     Pharynx: Uvula midline.  Eyes:     General:        Right eye: No discharge.        Left eye: No discharge.     Conjunctiva/sclera: Conjunctivae normal.     Right eye: Right conjunctiva is not injected. No chemosis.    Left eye: Left conjunctiva is not injected. No chemosis.    Pupils: Pupils  are equal, round, and reactive to light.  Cardiovascular:     Rate and Rhythm: Normal rate and regular rhythm.     Heart sounds: Normal heart sounds.  Pulmonary:     Effort: Pulmonary effort is normal. No tachypnea, accessory muscle usage or respiratory distress.     Breath sounds: Normal breath sounds. No wheezing, rhonchi or rales.  Chest:     Chest wall: No tenderness.  Lymphadenopathy:     Cervical: No cervical adenopathy.  Skin:    Coloration: Skin is not pale.     Findings: No abrasion, erythema, petechiae or rash. Rash is not papular, urticarial or vesicular.  Neurological:     Mental Status: She is alert.  Psychiatric:        Behavior: Behavior is cooperative.      Diagnostic studies: none    Malachi Bonds, MD  [facility]

## 2020-03-27 ENCOUNTER — Encounter: Payer: Self-pay | Admitting: Allergy & Immunology

## 2020-05-09 ENCOUNTER — Ambulatory Visit (INDEPENDENT_AMBULATORY_CARE_PROVIDER_SITE_OTHER): Payer: BC Managed Care – PPO

## 2020-05-09 DIAGNOSIS — J309 Allergic rhinitis, unspecified: Secondary | ICD-10-CM

## 2020-05-21 ENCOUNTER — Ambulatory Visit (INDEPENDENT_AMBULATORY_CARE_PROVIDER_SITE_OTHER): Payer: BC Managed Care – PPO

## 2020-05-21 DIAGNOSIS — J309 Allergic rhinitis, unspecified: Secondary | ICD-10-CM | POA: Diagnosis not present

## 2020-06-04 ENCOUNTER — Ambulatory Visit (INDEPENDENT_AMBULATORY_CARE_PROVIDER_SITE_OTHER): Payer: BC Managed Care – PPO

## 2020-06-04 DIAGNOSIS — J309 Allergic rhinitis, unspecified: Secondary | ICD-10-CM | POA: Diagnosis not present

## 2020-06-11 ENCOUNTER — Ambulatory Visit (INDEPENDENT_AMBULATORY_CARE_PROVIDER_SITE_OTHER): Payer: BC Managed Care – PPO

## 2020-06-11 DIAGNOSIS — J309 Allergic rhinitis, unspecified: Secondary | ICD-10-CM | POA: Diagnosis not present

## 2020-06-18 ENCOUNTER — Ambulatory Visit (INDEPENDENT_AMBULATORY_CARE_PROVIDER_SITE_OTHER): Payer: BC Managed Care – PPO

## 2020-06-18 DIAGNOSIS — J309 Allergic rhinitis, unspecified: Secondary | ICD-10-CM

## 2020-07-02 ENCOUNTER — Ambulatory Visit (INDEPENDENT_AMBULATORY_CARE_PROVIDER_SITE_OTHER): Payer: BC Managed Care – PPO

## 2020-07-02 DIAGNOSIS — J309 Allergic rhinitis, unspecified: Secondary | ICD-10-CM

## 2020-07-09 ENCOUNTER — Ambulatory Visit (INDEPENDENT_AMBULATORY_CARE_PROVIDER_SITE_OTHER): Payer: BC Managed Care – PPO

## 2020-07-09 DIAGNOSIS — J309 Allergic rhinitis, unspecified: Secondary | ICD-10-CM | POA: Diagnosis not present

## 2020-08-01 ENCOUNTER — Ambulatory Visit (INDEPENDENT_AMBULATORY_CARE_PROVIDER_SITE_OTHER): Payer: BC Managed Care – PPO

## 2020-08-01 DIAGNOSIS — J309 Allergic rhinitis, unspecified: Secondary | ICD-10-CM

## 2020-08-27 ENCOUNTER — Ambulatory Visit (INDEPENDENT_AMBULATORY_CARE_PROVIDER_SITE_OTHER): Payer: BC Managed Care – PPO

## 2020-08-27 DIAGNOSIS — J309 Allergic rhinitis, unspecified: Secondary | ICD-10-CM

## 2020-09-24 ENCOUNTER — Ambulatory Visit (INDEPENDENT_AMBULATORY_CARE_PROVIDER_SITE_OTHER): Payer: BC Managed Care – PPO

## 2020-09-24 DIAGNOSIS — J309 Allergic rhinitis, unspecified: Secondary | ICD-10-CM | POA: Diagnosis not present

## 2020-09-25 DIAGNOSIS — J301 Allergic rhinitis due to pollen: Secondary | ICD-10-CM

## 2020-09-25 NOTE — Progress Notes (Signed)
VIAL EXP 09-25-21

## 2020-10-15 ENCOUNTER — Ambulatory Visit (INDEPENDENT_AMBULATORY_CARE_PROVIDER_SITE_OTHER): Payer: BC Managed Care – PPO

## 2020-10-15 DIAGNOSIS — J309 Allergic rhinitis, unspecified: Secondary | ICD-10-CM | POA: Diagnosis not present

## 2020-10-20 ENCOUNTER — Other Ambulatory Visit: Payer: Self-pay | Admitting: Neurology

## 2020-11-12 ENCOUNTER — Ambulatory Visit (INDEPENDENT_AMBULATORY_CARE_PROVIDER_SITE_OTHER): Payer: BC Managed Care – PPO

## 2020-11-12 DIAGNOSIS — J309 Allergic rhinitis, unspecified: Secondary | ICD-10-CM

## 2020-11-25 ENCOUNTER — Other Ambulatory Visit: Payer: Self-pay | Admitting: Neurology

## 2020-12-05 ENCOUNTER — Ambulatory Visit (INDEPENDENT_AMBULATORY_CARE_PROVIDER_SITE_OTHER): Payer: BC Managed Care – PPO

## 2020-12-05 DIAGNOSIS — J309 Allergic rhinitis, unspecified: Secondary | ICD-10-CM | POA: Diagnosis not present

## 2020-12-10 ENCOUNTER — Ambulatory Visit (INDEPENDENT_AMBULATORY_CARE_PROVIDER_SITE_OTHER): Payer: BC Managed Care – PPO | Admitting: *Deleted

## 2020-12-10 DIAGNOSIS — J309 Allergic rhinitis, unspecified: Secondary | ICD-10-CM

## 2020-12-19 ENCOUNTER — Ambulatory Visit (INDEPENDENT_AMBULATORY_CARE_PROVIDER_SITE_OTHER): Payer: BC Managed Care – PPO

## 2020-12-19 DIAGNOSIS — J309 Allergic rhinitis, unspecified: Secondary | ICD-10-CM

## 2020-12-25 ENCOUNTER — Encounter: Payer: Self-pay | Admitting: Neurology

## 2020-12-25 ENCOUNTER — Ambulatory Visit: Payer: BC Managed Care – PPO | Admitting: Neurology

## 2020-12-25 VITALS — BP 156/98 | HR 71 | Ht 64.0 in | Wt 265.6 lb

## 2020-12-25 DIAGNOSIS — G40309 Generalized idiopathic epilepsy and epileptic syndromes, not intractable, without status epilepticus: Secondary | ICD-10-CM

## 2020-12-25 DIAGNOSIS — Z5181 Encounter for therapeutic drug level monitoring: Secondary | ICD-10-CM

## 2020-12-25 MED ORDER — LAMOTRIGINE 200 MG PO TABS: 200.0000 mg | ORAL_TABLET | Freq: Two times a day (BID) | ORAL | 3 refills | Status: DC

## 2020-12-25 MED ORDER — NORTRIPTYLINE HCL 10 MG PO CAPS: ORAL_CAPSULE | ORAL | 3 refills | Status: DC

## 2020-12-25 MED ORDER — LEVETIRACETAM 500 MG PO TABS: 500.0000 mg | ORAL_TABLET | Freq: Two times a day (BID) | ORAL | 3 refills | Status: DC

## 2020-12-25 NOTE — Patient Instructions (Addendum)
We will start nortriptyline at night for the headache.  Pamelor (nortriptyline) is an antidepressant medication that has many uses that may include headache, whiplash injuries, or for peripheral neuropathy pain. Side effects may include drowsiness, dry mouth, blurred vision, or constipation. As with any antidepressant medication, worsening depression may occur. If you had any significant side effects, please call our office. The full effects of this medication may take 7-10 days after starting the drug, or going up on the dose.

## 2020-12-25 NOTE — Progress Notes (Signed)
Reason for visit: Seizures, headache  Taylor Collier is an 45 y.o. female  History of present illness:  Taylor Collier is a 45 year old right-handed white female with a history of seizures that have been under good control with the combination of Keppra and Lamictal.  The patient has been on low-dose Topamax for headaches, but she found that it resulted in insomnia issues and she stopped the medication.  She is having about 1 or 2 headaches a week at this point.  She works as a Runner, broadcasting/film/video, she indicates that she snores at night.  She has some issues with obesity.  She has been evaluated for sleep apnea and was told that she had mild symptoms, but did not need CPAP.  She does snore at night, she wakes up several times in the evening.  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 Her smoking use included cigarettes. She started smoking about 26 years ago. She smoked an average of .25 packs per day. She has never used smokeless tobacco. She reports current drug use. Drug: Psilocybin. She reports that she does not drink alcohol.    Allergies  Allergen Reactions   Amoxicillin-Pot Clavulanate Rash   Penicillins Rash    Medications:  Prior to  Admission medications   Medication Sig Start Date End Date Taking? Authorizing Provider  acetaminophen (TYLENOL) 500 MG tablet Take 1,000 mg by mouth daily.   Yes [provider]  Biotin 5000 MCG TABS Take 1 tablet by mouth daily.   Yes [provider]  CALCIUM PO Take 600 mg by mouth daily.   Yes [provider]  chlorthalidone (HYGROTON) 25 MG tablet TAKE ONE TABLET BY MOUTH EVERY DAY - PT. NEEDS OFFICE VISIT 12/27/16  Yes Antoine Poche, MD  DYMISTA [phone] MCG/ACT SUSP INSTILL TWO SPRAYS INTO BOTH NOSTRILS DAILY 08/01/19  Yes Alfonse Spruce, MD  EPINEPHrine 0.3 mg/0.3 mL IJ SOAJ injection Inject one dose intramuscularly for allergic reaction. May repeat one dose if needed after 5-15 minutes. Proceed to the ER 01/18/18  Yes [provider]  folic acid (FOLVITE) 1 MG tablet TAKE 1 TABLET BY MOUTH TWICE DAILY 12/31/14  Yes York Spaniel, MD  labetalol (NORMODYNE) 200 MG tablet TAKE 1 TABLET BY MOUTH TWICE DAILY 02/07/18  Yes Branch, Dorothe Pea, MD  lamoTRIgine (LAMICTAL) 200 MG tablet Take 1 tablet (200 mg total) by mouth 2 (two) times daily. 12/26/19  Yes York Spaniel, MD  levETIRAcetam (KEPPRA) 500 MG tablet Take 1 tablet (500 mg total) by mouth 2 (two) times daily. 12/26/19  Yes Stephanie Acre  K, MD  Levocetirizine Dihydrochloride (XYZAL ALLERGY 24HR PO) Take 10 mg by mouth daily.   Yes [provider]  Multiple Vitamin (MULTIVITAMIN) tablet Take 1 tablet by mouth daily.   Yes [provider]  Multiple Vitamins-Minerals (ECHINACEA ACZ PO) Take 750 mcg by mouth daily.   Yes [provider]  Probiotic Product (PROBIOTIC PO) Take 1 capsule by mouth daily.   Yes [provider]  traZODone (DESYREL) 150 MG tablet TAKE 1 TABLET BY MOUTH AT BEDTIME 11/26/20  Yes York Spaniel, MD  UNABLE TO FIND Allergy Shot 1 per week   Yes [provider]    ROS:  Out of a complete 14 system review of symptoms, the  patient complains only of the following symptoms, and all other reviewed systems are negative.  Headache Snoring  Blood pressure (!) 156/98, pulse 71, height 5\' 4"  (1.626 m), weight 265 lb 9.6 oz (120.5 kg).  Physical Exam  General: The patient is alert and cooperative at the time of the examination.  The patient is moderately to markedly obese.  Skin: No significant peripheral edema is noted.   Neurologic Exam  Mental status: The patient is alert and oriented x 3 at the time of the examination. The patient has apparent normal recent and remote memory, with an apparently normal attention span and concentration ability.   Cranial nerves: Facial symmetry is present. Speech is normal, no aphasia or dysarthria is noted. Extraocular movements are full. Visual fields are full.  Motor: The patient has good strength in all 4 extremities.  Sensory examination: Soft touch sensation is symmetric on the face, arms, and legs.  Coordination: The patient has good finger-nose-finger and heel-to-shin bilaterally.  Gait and station: The patient has a normal gait. Tandem gait is normal. Romberg is negative. No drift is seen.  Reflexes: Deep tendon reflexes are symmetric.   Assessment/Plan:  1.  History of seizures under good control  2.  Migraine headache  The patient will be placed on low-dose nortriptyline for her headaches.  She was given a prescription for the lamotrigine and Keppra, she will have blood work today.  She will follow-up in 1 year, in the future she can be followed through Dr. .  Vickey Huger MD 12/25/2020 9:16 AM  [facility] [address] [place], [place]  Phone [phone] Fax [phone]

## 2020-12-26 ENCOUNTER — Ambulatory Visit (INDEPENDENT_AMBULATORY_CARE_PROVIDER_SITE_OTHER): Payer: BC Managed Care – PPO

## 2020-12-26 DIAGNOSIS — J309 Allergic rhinitis, unspecified: Secondary | ICD-10-CM

## 2020-12-28 LAB — COMPREHENSIVE METABOLIC PANEL
ALT: 33 IU/L — ABNORMAL HIGH (ref 0–32)
AST: 24 IU/L (ref 0–40)
Albumin/Globulin Ratio: 1.8 (ref 1.2–2.2)
Albumin: 4.6 g/dL (ref 3.8–4.8)
Alkaline Phosphatase: 83 IU/L (ref 44–121)
BUN/Creatinine Ratio: 19 (ref 9–23)
BUN: 14 mg/dL (ref 6–24)
Bilirubin Total: 0.5 mg/dL (ref 0.0–1.2)
CO2: 22 mmol/L (ref 20–29)
Calcium: 10.3 mg/dL — ABNORMAL HIGH (ref 8.7–10.2)
Chloride: 97 mmol/L (ref 96–106)
Creatinine, Ser: 0.74 mg/dL (ref 0.57–1.00)
Globulin, Total: 2.5 g/dL (ref 1.5–4.5)
Glucose: 87 mg/dL (ref 65–99)
Potassium: 3.9 mmol/L (ref 3.5–5.2)
Sodium: 137 mmol/L (ref 134–144)
Total Protein: 7.1 g/dL (ref 6.0–8.5)
eGFR: 100 mL/min/{1.73_m2} (ref >59)

## 2020-12-28 LAB — CBC WITH DIFFERENTIAL/PLATELET
Basophils Absolute: 0.1 10*3/uL (ref 0.0–0.2)
Basos: 1 %
EOS (ABSOLUTE): 0 10*3/uL (ref 0.0–0.4)
Eos: 0 %
Hematocrit: 38.6 % (ref 34.0–46.6)
Hemoglobin: 13.1 g/dL (ref 11.1–15.9)
Immature Grans (Abs): 0 10*3/uL (ref 0.0–0.1)
Immature Granulocytes: 0 %
Lymphocytes Absolute: 2 10*3/uL (ref 0.7–3.1)
Lymphs: 24 %
MCH: 29.7 pg (ref 26.6–33.0)
MCHC: 33.9 g/dL (ref 31.5–35.7)
MCV: 88 fL (ref 79–97)
Monocytes Absolute: 0.6 10*3/uL (ref 0.1–0.9)
Monocytes: 7 %
Neutrophils Absolute: 5.8 10*3/uL (ref 1.4–7.0)
Neutrophils: 68 %
Platelets: 340 10*3/uL (ref 150–450)
RBC: 4.41 x10E6/uL (ref 3.77–5.28)
RDW: 12.8 % (ref 11.7–15.4)
WBC: 8.4 10*3/uL (ref 3.4–10.8)

## 2020-12-28 LAB — LAMOTRIGINE LEVEL: Lamotrigine Lvl: 7.5 ug/mL (ref 2.0–20.0)

## 2020-12-28 LAB — LEVETIRACETAM LEVEL: Levetiracetam Lvl: 16.2 ug/mL (ref 10.0–40.0)

## 2021-01-02 ENCOUNTER — Ambulatory Visit (INDEPENDENT_AMBULATORY_CARE_PROVIDER_SITE_OTHER): Payer: BC Managed Care – PPO

## 2021-01-02 DIAGNOSIS — J309 Allergic rhinitis, unspecified: Secondary | ICD-10-CM | POA: Diagnosis not present

## 2021-02-04 ENCOUNTER — Ambulatory Visit (INDEPENDENT_AMBULATORY_CARE_PROVIDER_SITE_OTHER): Payer: BC Managed Care – PPO | Admitting: *Deleted

## 2021-02-04 DIAGNOSIS — J309 Allergic rhinitis, unspecified: Secondary | ICD-10-CM | POA: Diagnosis not present

## 2021-03-04 ENCOUNTER — Ambulatory Visit (INDEPENDENT_AMBULATORY_CARE_PROVIDER_SITE_OTHER): Payer: BC Managed Care – PPO

## 2021-03-04 DIAGNOSIS — J309 Allergic rhinitis, unspecified: Secondary | ICD-10-CM | POA: Diagnosis not present

## 2021-04-01 ENCOUNTER — Ambulatory Visit (INDEPENDENT_AMBULATORY_CARE_PROVIDER_SITE_OTHER): Payer: BC Managed Care – PPO

## 2021-04-01 DIAGNOSIS — J309 Allergic rhinitis, unspecified: Secondary | ICD-10-CM

## 2021-04-10 ENCOUNTER — Ambulatory Visit: Payer: BC Managed Care – PPO | Admitting: Allergy & Immunology

## 2021-04-10 DIAGNOSIS — J309 Allergic rhinitis, unspecified: Secondary | ICD-10-CM

## 2021-04-29 ENCOUNTER — Other Ambulatory Visit: Payer: Self-pay

## 2021-04-29 ENCOUNTER — Ambulatory Visit (INDEPENDENT_AMBULATORY_CARE_PROVIDER_SITE_OTHER): Payer: BC Managed Care – PPO

## 2021-04-29 DIAGNOSIS — J309 Allergic rhinitis, unspecified: Secondary | ICD-10-CM | POA: Diagnosis not present

## 2021-04-29 MED ORDER — NORTRIPTYLINE HCL 10 MG PO CAPS: ORAL_CAPSULE | ORAL | 3 refills | Status: DC

## 2021-04-30 DIAGNOSIS — J301 Allergic rhinitis due to pollen: Secondary | ICD-10-CM | POA: Diagnosis not present

## 2021-04-30 NOTE — Progress Notes (Signed)
VIAL MADE. EXP 04-30-22

## 2021-05-20 ENCOUNTER — Ambulatory Visit (INDEPENDENT_AMBULATORY_CARE_PROVIDER_SITE_OTHER): Payer: BC Managed Care – PPO | Admitting: *Deleted

## 2021-05-20 DIAGNOSIS — J309 Allergic rhinitis, unspecified: Secondary | ICD-10-CM

## 2021-06-24 ENCOUNTER — Ambulatory Visit (INDEPENDENT_AMBULATORY_CARE_PROVIDER_SITE_OTHER): Payer: BC Managed Care – PPO

## 2021-06-24 DIAGNOSIS — J309 Allergic rhinitis, unspecified: Secondary | ICD-10-CM

## 2021-07-15 ENCOUNTER — Ambulatory Visit (INDEPENDENT_AMBULATORY_CARE_PROVIDER_SITE_OTHER): Payer: BC Managed Care – PPO

## 2021-07-15 DIAGNOSIS — J309 Allergic rhinitis, unspecified: Secondary | ICD-10-CM

## 2021-07-22 ENCOUNTER — Ambulatory Visit (INDEPENDENT_AMBULATORY_CARE_PROVIDER_SITE_OTHER): Payer: BC Managed Care – PPO

## 2021-07-22 DIAGNOSIS — J309 Allergic rhinitis, unspecified: Secondary | ICD-10-CM | POA: Diagnosis not present

## 2021-07-29 ENCOUNTER — Ambulatory Visit (INDEPENDENT_AMBULATORY_CARE_PROVIDER_SITE_OTHER): Payer: BC Managed Care – PPO

## 2021-07-29 DIAGNOSIS — J309 Allergic rhinitis, unspecified: Secondary | ICD-10-CM

## 2021-08-11 NOTE — Patient Instructions (Addendum)
1. Seasonal and perennial allergic rhinitis ?- Continue with allergy shots at the same schedule. ?- Continue with the as needed use of your medications.  ?- Continue with Xyzal 5mg  daily daily.   ?- EpiPen refilled ?- April 2025 will be (full five years). ?-Keep a journal of foods that cause you to sneeze ? ?Schedule a follow up appointment in 1 year or sooner if needed ? ?

## 2021-08-12 ENCOUNTER — Ambulatory Visit: Payer: Self-pay

## 2021-08-12 ENCOUNTER — Encounter: Payer: Self-pay | Admitting: Family

## 2021-08-12 ENCOUNTER — Ambulatory Visit: Payer: BC Managed Care – PPO | Admitting: Family

## 2021-08-12 ENCOUNTER — Other Ambulatory Visit: Payer: Self-pay

## 2021-08-12 VITALS — BP 140/98 | HR 84 | Temp 98.1°F | Resp 18 | Ht 64.0 in | Wt 265.0 lb

## 2021-08-12 DIAGNOSIS — J309 Allergic rhinitis, unspecified: Secondary | ICD-10-CM | POA: Diagnosis not present

## 2021-08-12 DIAGNOSIS — J302 Other seasonal allergic rhinitis: Secondary | ICD-10-CM

## 2021-08-12 MED ORDER — AZELASTINE-FLUTICASONE 137-50 MCG/ACT NA SUSP: NASAL | 5 refills | Status: DC

## 2021-08-12 MED ORDER — LEVOCETIRIZINE DIHYDROCHLORIDE 5 MG PO TABS: ORAL_TABLET | ORAL | 5 refills | Status: DC

## 2021-08-12 MED ORDER — EPINEPHRINE 0.3 MG/0.3ML IJ SOAJ: INTRAMUSCULAR | 1 refills | Status: DC

## 2021-08-12 NOTE — Progress Notes (Signed)
? ?[address], [address] ?[place] [address] ?Dept: [phone] ? ?FOLLOW UP NOTE ? ?Patient ID: Taylor Collier, female    DOB: 1976/05/10  Age: 46 y.o. MRN: [id number] ?Date of Office Visit: 08/12/2021 ? ?Assessment  ?Chief Complaint: Allergic Rhinitis  (Says she sneezes a lot after she eats and this happens frequently she states. Still taking current allergy medications. ) ? ?HPI ?Taylor Collier is a 46 year old female who presents today for follow-up of seasonal and perennial allergic rhinitis.  She was last seen on March 27, 2020 by Dr. Dellis Anes.  Since her last office visit she denies any new diagnosis or surgeries. ? ?Seasonal and perennial allergic rhinitis is reported as moderately controlled with allergy injections per protocol, Dymista nasal spray as needed, and Xyzal 5 mg daily as needed.  She reports that her allergy injections do help and she can tell if she has to skip a week due to work.  She also denies large local reactions at the injection site.  She is interested in continuing doing her allergy injections.  She reports sneezing fits at times.  She especially notices this at dinnertime.  She has not been able to correlate the sneezing with any certain food.  She denies rhinorrhea, nasal congestion, and postnasal drip.  She has had 2 sinus infections since we last saw her. ? ? ?Drug Allergies:  ?Allergies  ?Allergen Reactions  ? Amoxicillin-Pot Clavulanate Rash  ? Penicillins Rash  ? ? ?Review of Systems: ?Review of Systems  ?Constitutional:  Negative for chills and fever.  ?HENT:    ?     Reports sneezing at times when eating.  This occurs mostly at night.  Denies rhinorrhea, nasal congestion, and postnasal drip.  ?Eyes:   ?     Reports watery eyes and denies itchy eyes  ?Respiratory:  Negative for cough, shortness of breath and wheezing.   ?Cardiovascular:  Negative for chest pain and palpitations.  ?Gastrointestinal:   ?     Denies heartburn or reflux symptoms  ?Genitourinary:   Negative for frequency.  ?Skin:  Negative for itching and rash.  ?Neurological:  Negative for headaches.  ?Endo/Heme/Allergies:  Positive for environmental allergies.  ? ? ?Physical Exam: ?BP (!) 140/98   Pulse 84   Temp 98.1 ?F (36.7 ?C) (Temporal)   Resp 18   Ht 5\' 4"  (1.626 m)   Wt 265 lb (120.2 kg)   SpO2 97%   BMI 45.49 kg/m?   ? ?Physical Exam ?Constitutional:   ?   Appearance: Normal appearance.  ?HENT:  ?   Head: Normocephalic and atraumatic.  ?   Comments: Pharynx normal, eyes normal ears normal, nose: Slight irritation noted in left nostril.  Right nostril normal ?   Right Ear: Tympanic membrane, ear canal and external ear normal.  ?   Left Ear: Tympanic membrane, ear canal and external ear normal.  ?   Mouth/Throat:  ?   Mouth: Mucous membranes are moist.  ?   Pharynx: Oropharynx is clear.  ?Eyes:  ?   Conjunctiva/sclera: Conjunctivae normal.  ?Cardiovascular:  ?   Rate and Rhythm: Regular rhythm.  ?   Heart sounds: Normal heart sounds.  ?Pulmonary:  ?   Effort: Pulmonary effort is normal.  ?   Breath sounds: Normal breath sounds.  ?   Comments: Lungs clear to auscultation ?Musculoskeletal:  ?   Cervical back: Neck supple.  ?Skin: ?   General: Skin is warm.  ?Neurological:  ?  Mental Status: She is alert and oriented to person, place, and time.  ?Psychiatric:     ?   Mood and Affect: Mood normal.     ?   Behavior: Behavior normal.     ?   Thought Content: Thought content normal.     ?   Judgment: Judgment normal.  ? ? ?Diagnostics: ?None ? ?Assessment and Plan: ?1. Seasonal and perennial allergic rhinitis   ? ? ?Meds ordered this encounter  ?Medications  ? EPINEPHrine 0.3 mg/0.3 mL IJ SOAJ injection  ?  Sig: Inject one dose intramuscularly for allergic reaction. May repeat one dose if needed after 5-15 minutes. Proceed to the ER  ?  Dispense:  1 each  ?  Refill:  1  ? Azelastine-Fluticasone (DYMISTA) 137-50 MCG/ACT SUSP  ?  Sig: INSTILL TWO SPRAYS INTO BOTH NOSTRILS DAILY  ?  Dispense:  23 g  ?   Refill:  5  ? levocetirizine (XYZAL ALLERGY 24HR) 5 MG tablet  ?  Sig: Take 1 tablet daily as needed for runny nose  ?  Dispense:  30 tablet  ?  Refill:  5  ? ? ?Patient Instructions  ?1. Seasonal and perennial allergic rhinitis ?- Continue with allergy shots at the same schedule. ?- Continue with the as needed use of your medications.  ?- Continue with Xyzal 5mg  daily daily.   ?- EpiPen refilled ?- April 2025 will be (full five years). ?-Keep a journal of foods that cause you to sneeze ? ?Schedule a follow up appointment in 1 year or sooner if needed ? ? ?Return in about 1 year (around 08/13/2022), or if symptoms worsen or fail to improve. ?  ? ?Thank you for the opportunity to care for this patient.  Please do not hesitate to contact me with questions. ? ?08/15/2022, FNP ?[facility] ? ? ? ? ?

## 2021-08-19 ENCOUNTER — Ambulatory Visit (INDEPENDENT_AMBULATORY_CARE_PROVIDER_SITE_OTHER): Payer: BC Managed Care – PPO

## 2021-08-19 DIAGNOSIS — J309 Allergic rhinitis, unspecified: Secondary | ICD-10-CM

## 2021-08-24 ENCOUNTER — Other Ambulatory Visit: Payer: Self-pay | Admitting: Neurology

## 2021-09-02 ENCOUNTER — Ambulatory Visit (INDEPENDENT_AMBULATORY_CARE_PROVIDER_SITE_OTHER): Payer: BC Managed Care – PPO

## 2021-09-02 DIAGNOSIS — J309 Allergic rhinitis, unspecified: Secondary | ICD-10-CM | POA: Diagnosis not present

## 2021-10-07 ENCOUNTER — Ambulatory Visit (INDEPENDENT_AMBULATORY_CARE_PROVIDER_SITE_OTHER): Payer: BC Managed Care – PPO

## 2021-10-07 DIAGNOSIS — J309 Allergic rhinitis, unspecified: Secondary | ICD-10-CM

## 2021-10-21 ENCOUNTER — Other Ambulatory Visit: Payer: Self-pay | Admitting: Neurology

## 2021-11-10 ENCOUNTER — Ambulatory Visit: Payer: BC Managed Care – PPO | Admitting: Neurology

## 2021-11-13 ENCOUNTER — Ambulatory Visit (INDEPENDENT_AMBULATORY_CARE_PROVIDER_SITE_OTHER): Payer: BC Managed Care – PPO

## 2021-11-13 DIAGNOSIS — J309 Allergic rhinitis, unspecified: Secondary | ICD-10-CM | POA: Diagnosis not present

## 2021-11-16 DIAGNOSIS — J301 Allergic rhinitis due to pollen: Secondary | ICD-10-CM

## 2021-11-17 ENCOUNTER — Other Ambulatory Visit: Payer: Self-pay | Admitting: Neurology

## 2021-11-17 NOTE — Progress Notes (Signed)
VIAL EXP 11-18-22

## 2021-12-08 ENCOUNTER — Ambulatory Visit: Payer: BC Managed Care – PPO | Admitting: Neurology

## 2021-12-16 ENCOUNTER — Other Ambulatory Visit: Payer: Self-pay | Admitting: Neurology

## 2021-12-16 ENCOUNTER — Other Ambulatory Visit: Payer: Self-pay

## 2021-12-16 MED ORDER — LAMOTRIGINE 200 MG PO TABS: 200.0000 mg | ORAL_TABLET | Freq: Two times a day (BID) | ORAL | 0 refills | Status: DC

## 2021-12-28 ENCOUNTER — Ambulatory Visit: Payer: BC Managed Care – PPO | Admitting: Neurology

## 2021-12-28 ENCOUNTER — Encounter: Payer: Self-pay | Admitting: Neurology

## 2021-12-28 VITALS — BP 143/92 | HR 79 | Ht 64.0 in | Wt 284.5 lb

## 2021-12-28 DIAGNOSIS — Z5181 Encounter for therapeutic drug level monitoring: Secondary | ICD-10-CM | POA: Diagnosis not present

## 2021-12-28 DIAGNOSIS — R0683 Snoring: Secondary | ICD-10-CM | POA: Insufficient documentation

## 2021-12-28 DIAGNOSIS — G40A09 Absence epileptic syndrome, not intractable, without status epilepticus: Secondary | ICD-10-CM | POA: Insufficient documentation

## 2021-12-28 DIAGNOSIS — G40A11 Absence epileptic syndrome, intractable, with status epilepticus: Secondary | ICD-10-CM | POA: Insufficient documentation

## 2021-12-28 MED ORDER — TRAZODONE HCL 150 MG PO TABS
150.0000 mg | ORAL_TABLET | Freq: Every day | ORAL | 3 refills | Status: DC
Start: 2021-12-28 — End: 2023-01-17

## 2021-12-28 MED ORDER — LEVETIRACETAM 500 MG PO TABS
500.0000 mg | ORAL_TABLET | Freq: Two times a day (BID) | ORAL | 3 refills | Status: DC
Start: 2021-12-28 — End: 2022-09-07

## 2021-12-28 MED ORDER — LAMOTRIGINE 200 MG PO TABS: 200.0000 mg | ORAL_TABLET | Freq: Two times a day (BID) | ORAL | 3 refills | Status: DC

## 2021-12-28 NOTE — Patient Instructions (Signed)
Quality Sleep Information, Adult Quality sleep is important for your mental and physical health. It also improves your quality of life. Quality sleep means you: Are asleep for most of the time you are in bed. Fall asleep within 30 minutes. Wake up no more than once a night. Are awake for no longer than 20 minutes if you do wake up during the night. Most adults need 7-8 hours of quality sleep each night. How can poor sleep affect me? If you do not get enough quality sleep, you may have: Mood swings. Daytime sleepiness. Decreased alertness, reaction time, and concentration. Sleep disorders, such as insomnia and sleep apnea. Difficulty with: Solving problems. Coping with stress. Paying attention. These issues may affect your performance and productivity at work, school, and home. Lack of sleep may also put you at higher risk for accidents, suicide, and risky behaviors. If you do not get quality sleep, you may also be at higher risk for several health problems, including: Infections. Type 2 diabetes. Heart disease. High blood pressure. Obesity. Worsening of long-term conditions, like arthritis, kidney disease, depression, Parkinson's disease, and epilepsy. What actions can I take to get more quality sleep? Sleep schedule and routine Stick to a sleep schedule. Go to sleep and wake up at about the same time each day. Do not try to sleep less on weekdays and make up for lost sleep on weekends. This does not work. Limit naps during the day to 30 minutes or less. Do not take naps in the late afternoon. Make time to relax before bed. Reading, listening to music, or taking a hot bath promotes quality sleep. Make your bedroom a place that promotes quality sleep. Keep your bedroom dark, quiet, and at a comfortable room temperature. Make sure your bed is comfortable. Avoid using electronic devices that give off bright blue light for 30 minutes before bedtime. Your brain perceives bright blue light  as sunlight. This includes television, phones, and computers. If you are lying awake in bed for longer than 20 minutes, get up and do a relaxing activity until you feel sleepy. Lifestyle     Try to get at least 30 minutes of exercise on most days. Do not exercise 2-3 hours before going to bed. Do not use any products that contain nicotine or tobacco. These products include cigarettes, chewing tobacco, and vaping devices, such as e-cigarettes. If you need help quitting, ask your health care provider. Do not drink caffeinated beverages for at least 8 hours before going to bed. Coffee, tea, and some sodas contain caffeine. Do not drink alcohol or eat large meals close to bedtime. Try to get at least 30 minutes of sunlight every day. Morning sunlight is best. Medical concerns Work with your health care provider to treat medical conditions that may affect sleeping, such as: Nasal obstruction. Snoring. Sleep apnea and other sleep disorders. Talk to your health care provider if you think any of your prescription medicines may cause you to have difficulty falling or staying asleep. If you have sleep problems, talk with a sleep consultant. If you think you have a sleep disorder, talk with your health care provider about getting evaluated by a specialist. Where to find more information Sleep Foundation: sleepfoundation.org American Academy of Sleep Medicine: aasm.org Centers for Disease Control and Prevention (CDC): StoreMirror.com.cy Contact a health care provider if: You have trouble getting to sleep or staying asleep. You often wake up very early in the morning and cannot get back to sleep. You have daytime sleepiness. You  have daytime sleep attacks of suddenly falling asleep and sudden muscle weakness (narcolepsy). You have a tingling sensation in your legs with a strong urge to move your legs (restless legs syndrome). You stop breathing briefly during sleep (sleep apnea). You think you have a sleep  disorder or are taking a medicine that is affecting your quality of sleep. Summary Most adults need 7-8 hours of quality sleep each night. Getting enough quality sleep is important for your mental and physical health. Make your bedroom a place that promotes quality sleep, and avoid things that may cause you to have poor sleep, such as alcohol, caffeine, smoking, or large meals. Talk to your health care provider if you have trouble falling asleep or staying asleep. This information is not intended to replace advice given to you by your health care provider. Make sure you discuss any questions you have with your health care provider. Document Revised: 09/02/2021 Document Reviewed: 09/02/2021 Elsevier Patient Education  2023 Elsevier Inc. Insomnia Insomnia is a sleep disorder that makes it difficult to fall asleep or stay asleep. Insomnia can cause fatigue, low energy, difficulty concentrating, mood swings, and poor performance at work or school. There are three different ways to classify insomnia: Difficulty falling asleep. Difficulty staying asleep. Waking up too early in the morning. Any type of insomnia can be long-term (chronic) or short-term (acute). Both are common. Short-term insomnia usually lasts for 3 months or less. Chronic insomnia occurs at least three times a week for longer than 3 months. What are the causes? Insomnia may be caused by another condition, situation, or substance, such as: Having certain mental health conditions, such as anxiety and depression. Using caffeine, alcohol, tobacco, or drugs. Having gastrointestinal conditions, such as gastroesophageal reflux disease (GERD). Having certain medical conditions. These include: Asthma. Alzheimer's disease. Stroke. Chronic pain. An overactive thyroid gland (hyperthyroidism). Other sleep disorders, such as restless legs syndrome and sleep apnea. Menopause. Sometimes, the cause of insomnia may not be known. What  increases the risk? Risk factors for insomnia include: Gender. Females are affected more often than males. Age. Insomnia is more common as people get older. Stress and certain medical and mental health conditions. Lack of exercise. Having an irregular work schedule. This may include working night shifts and traveling between different time zones. What are the signs or symptoms? If you have insomnia, the main symptom is having trouble falling asleep or having trouble staying asleep. This may lead to other symptoms, such as: Feeling tired or having low energy. Feeling nervous about going to sleep. Not feeling rested in the morning. Having trouble concentrating. Feeling irritable, anxious, or depressed. How is this diagnosed? This condition may be diagnosed based on: Your symptoms and medical history. Your health care provider may ask about: Your sleep habits. Any medical conditions you have. Your mental health. A physical exam. How is this treated? Treatment for insomnia depends on the cause. Treatment may focus on treating an underlying condition that is causing the insomnia. Treatment may also include: Medicines to help you sleep. Counseling or therapy. Lifestyle adjustments to help you sleep better. Follow these instructions at home: Eating and drinking  Limit or avoid alcohol, caffeinated beverages, and products that contain nicotine and tobacco, especially close to bedtime. These can disrupt your sleep. Do not eat a large meal or eat spicy foods right before bedtime. This can lead to digestive discomfort that can make it hard for you to sleep. Sleep habits  Keep a sleep diary to help you and  your health care provider figure out what could be causing your insomnia. Write down: When you sleep. When you wake up during the night. How well you sleep and how rested you feel the next day. Any side effects of medicines you are taking. What you eat and drink. Make your bedroom a  dark, comfortable place where it is easy to fall asleep. Put up shades or blackout curtains to block light from outside. Use a white noise machine to block noise. Keep the temperature cool. Limit screen use before bedtime. This includes: Not watching TV. Not using your smartphone, tablet, or computer. Stick to a routine that includes going to bed and waking up at the same times every day and night. This can help you fall asleep faster. Consider making a quiet activity, such as reading, part of your nighttime routine. Try to avoid taking naps during the day so that you sleep better at night. Get out of bed if you are still awake after 15 minutes of trying to sleep. Keep the lights down, but try reading or doing a quiet activity. When you feel sleepy, go back to bed. General instructions Take over-the-counter and prescription medicines only as told by your health care provider. Exercise regularly as told by your health care provider. However, avoid exercising in the hours right before bedtime. Use relaxation techniques to manage stress. Ask your health care provider to suggest some techniques that may work well for you. These may include: Breathing exercises. Routines to release muscle tension. Visualizing peaceful scenes. Make sure that you drive carefully. Do not drive if you feel very sleepy. Keep all follow-up visits. This is important. Contact a health care provider if: You are tired throughout the day. You have trouble in your daily routine due to sleepiness. You continue to have sleep problems, or your sleep problems get worse. Get help right away if: You have thoughts about hurting yourself or someone else. Get help right away if you feel like you may hurt yourself or others, or have thoughts about taking your own life. Go to your nearest emergency room or: Call 911. Call the National Suicide Prevention Lifeline at 2515649291 or 988. This is open 24 hours a day. Text the Crisis  Text Line at 980-129-2843. Summary Insomnia is a sleep disorder that makes it difficult to fall asleep or stay asleep. Insomnia can be long-term (chronic) or short-term (acute). Treatment for insomnia depends on the cause. Treatment may focus on treating an underlying condition that is causing the insomnia. Keep a sleep diary to help you and your health care provider figure out what could be causing your insomnia. This information is not intended to replace advice given to you by your health care provider. Make sure you discuss any questions you have with your health care provider. Document Revised: 04/20/2021 Document Reviewed: 04/20/2021 Elsevier Patient Education  2023 [employer].

## 2021-12-28 NOTE — Progress Notes (Signed)
Provider:  Larey Seat, MD  Primary Care Physician:  Curlene Labrum, MD [place] [id number]     Referring Provider: Curlene Labrum, Md [address] [place],  [place] [address]          Chief Complaint according to patient   Patient presents with:     New Patient (Initial Visit)           HISTORY OF PRESENT ILLNESS:   12-28-2021. Taylor Collier is a 46 y.o. Caucasian female patient seen here as a Transfer of care on 12/28/2021 from Dr Jannifer Franklin- for no other reason than her sleep study at [facility] in 2013-  for an epilepsy follow up her today.    She does snore, is sleeping long hours and takes daily naps, we also were informed about witnessed  apneas in supine sleep.  Chief concern according to patient :   Pt here for yearly f/u for sz and migraine. Dr. Jannifer Franklin pt. Pt reports no sz like activity. Husband does report occasional moments where pt will zone out and not remember things. She also reports seeing bright lights coming from certain places. No missed dose but, dose have times where she takes it at a later time.  Pt reports doing well on nortriptyline. Migraine have been stable.  Teaches elementary school. Daily naps, long hours.                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 Her husband noted her staring off while dining out-  having no memory of what he just said, and she can't remember what happened during the last 15 minutes. She forgot the whole restaurant visit all together.    The patient had ta sleep study in the year 2013  with a result of mildest apnea, too mld to treat.      Family medical  history:  No other family member with seizure, both GM had  alzheimer's.  father was alcohol dependent.  Social history:  Patient is working as a Pharmacist, hospital  and lives in a household with spouse,  without children. The patient currently works/ used to work in shifts( night/ rotating,) Tobacco use: quit 1999.   ETOH use ; rarely-,  Caffeine intake in form of Coffee( 2 cups in AM) Soda( m dew 2/ a day) . Regular exercise in form of walking.   Hobbies :reading.    Sleep habits are as follows: the patient snores and her husband has witnessed rare apneas in supine.   Review of Systems: Out of a complete 14 system review, the patient complains of only the following symptoms, and all other reviewed systems are negative.:  Fatigue, sleepiness , snoring,    How likely are you to doze in the following situations: 0 = not likely, 1 = slight chance, 2 = moderate chance, 3 = high chance   Sitting and Reading? Watching Television? Sitting inactive in a public place (theater or meeting)? As a passenger in a car for an hour without a break? Lying down in the afternoon when circumstances permit? Sitting and talking to someone? Sitting quietly after lunch without alcohol? In a car, while stopped for a few minutes in traffic?   Total = n/a/  24 points   FSS endorsed at n/a / 63 points.   Depression: n/a   Last seizure over a decade ago.   Social History   Socioeconomic History   Marital status: Married    Spouse name: Not on file   Number of children: 2   Years of education: MA   Highest education level: Not on file  Occupational History   Occupation: teacher  Tobacco Use   Smoking status: Former    Packs/day: 0.25    Types: Cigarettes    Start date: 09/23/1994    Quit date: 09/22/1997    Years since quitting: 24.2   Smokeless tobacco: Never   Tobacco comments:    quit 2003  Vaping Use   Vaping Use: Never used  Substance and Sexual Activity   Alcohol use: No    Comment: on occasion   Drug use: Yes    Types: Psilocybin   Sexual activity: Yes   Other Topics Concern   Not on file  Social History Narrative   Pt lives at home, married   Patient drinks 2-3 cups of caffeine daily.   Patient is right handed.   Social Determinants of Health   Financial Resource Strain: Not on file  Food Insecurity: Not on file  Transportation Needs: Not on file  Physical Activity: Not on file  Stress: Not on file  Social Connections: Not on file    Family History  Problem Relation Age of Onset   Colon cancer Mother    Aneurysm Father    Breast cancer Sister    Diabetes Paternal Aunt    Alzheimer's disease Maternal Grandmother    Uterine cancer Maternal Grandmother     Past Medical History:  Diagnosis Date   Anxiety    Apnea    witnessed with snoring   Chronic insomnia 12/24/2014   Common migraine with intractable migraine 12/26/2019   Generalized convulsive epilepsy without mention of intractable epilepsy 11/16/2013   Hypertension    Obesity    OSA (obstructive sleep apnea)    Seizures ([place])    last seizure 05/2011    Past Surgical History:  Procedure Laterality Date   birthmark resection     fallopian tube blockage resection     OVARIAN CYST REMOVAL       Current Outpatient Medications on File Prior to Visit  Medication Sig Dispense Refill   acetaminophen (TYLENOL) 500 MG tablet Take 1,000 mg by mouth daily.     Azelastine-Fluticasone (DYMISTA) 137-50 MCG/ACT SUSP INSTILL TWO SPRAYS INTO BOTH NOSTRILS DAILY 23 g 5   Biotin 5000 MCG TABS Take 1 tablet by mouth daily.     CALCIUM PO Take 600 mg by mouth daily.     chlorthalidone (HYGROTON) 25 MG tablet TAKE ONE TABLET BY MOUTH EVERY DAY - PT. NEEDS OFFICE VISIT 7 tablet 0   EPINEPHrine 0.3 mg/0.3 mL IJ SOAJ injection Inject one dose intramuscularly for allergic reaction. May repeat one dose if needed after 5-15 minutes. Proceed to the ER 1 each 1   FLOWFLEX COVID-19 AG HOME TEST KIT See admin instructions.     folic acid (FOLVITE) 1 MG tablet TAKE 1 TABLET BY MOUTH TWICE  DAILY 60 tablet 12   labetalol (NORMODYNE) 200 MG tablet TAKE 1 TABLET BY MOUTH TWICE DAILY 4 tablet 0   lamoTRIgine (LAMICTAL) 200 MG tablet Take 1 tablet (200 mg total) by mouth 2 (two) times daily. 180 tablet 0   levETIRAcetam (KEPPRA) 500 MG tablet TAKE 1  TABLET BY MOUTH TWICE DAILY 180 tablet 0   levocetirizine (XYZAL ALLERGY 24HR) 5 MG tablet Take 1 tablet daily as needed for runny nose 30 tablet 5   Multiple Vitamin (MULTIVITAMIN) tablet Take 1 tablet by mouth daily.     nortriptyline (PAMELOR) 10 MG capsule TAKE THREE CAPSULES BY MOUTH NIGHTLY 90 capsule 0   traZODone (DESYREL) 150 MG tablet TAKE 1 TABLET BY MOUTH AT BEDTIME 90 tablet 3   UNABLE TO FIND Allergy Shot 1 per week     No current facility-administered medications on file prior to visit.    Allergies  Allergen Reactions   Amoxicillin-Pot Clavulanate Rash   Penicillins Rash    Physical exam:  Today's Vitals   12/28/21 0938  BP: (!) 143/92  Pulse: 79  Weight: 284 lb 8 oz (129 kg)  Height: _0  (1.626 m)   Body mass index is 48.83 kg/m.   Wt Readings from Last 3 Encounters:  12/28/21 284 lb 8 oz (129 kg)  08/12/21 265 lb (120.2 kg)  12/25/20 265 lb 9.6 oz (120.5 kg)     Ht Readings from Last 3 Encounters:  12/28/21 _1  (1.626 m)  08/12/21 _2  (1.626 m)  12/25/20 _3  (1.626 m)      General: The patient is awake, alert and appears not in acute distress. The patient is well groomed. Head: Normocephalic, atraumatic. Neck is supple.  Mallampati 2,  neck circumference:17.25 inches . Nasal airflow congested- Retrognathia is mildy seen.  Dental status: biological  Cardiovascular:  Regular rate and cardiac rhythm by pulse,  without distended neck veins. Respiratory: Lungs are clear to auscultation.  Skin:  Without evidence of ankle edema, or rash. Trunk: The patient's posture is erect.   Neurologic exam : The patient is awake and alert, oriented to place and time.   Memory subjective described as  intact.  Attention span & concentration ability appears normal.  Speech is fluent,  without  dysarthria, dysphonia or aphasia.  Mood and affect are appropriate.   Cranial nerves: no loss of smell or taste reported  Pupils are equal and briskly reactive to light. Funduscopic exam deferred. .  Extraocular movements in vertical and horizontal planes were intact and without nystagmus. No Diplopia. Visual fields by finger perimetry are intact. Hearing was intact to soft voice and finger rubbing.    Facial sensation intact to fine touch.  Facial motor strength is symmetric and tongue and uvula move midline.  Neck ROM : rotation, tilt and flexion extension were normal for age and shoulder shrug was symmetrical.    Motor exam:  Symmetric bulk, tone and ROM.  Knee pain- osteoarthritis  Normal tone without cog wheeling, symmetric grip strength .   Sensory:  Fine touch, pinprick and vibration were tested  and  normal.  Proprioception tested in the upper extremities was normal.   Coordination: Rapid alternating movements in the fingers/hands were of normal speed.  The Finger-to-nose maneuver was intact without evidence of ataxia, dysmetria or tremor.   Gait and station: Patient could rise unassisted from a seated position, walked without assistive device.  Stance is of normal width/ base and the patient turned with 3 steps.  Toe and heel walk were deferred.  Deep tendon reflexes: in the  upper and lower extremities are symmetric and intact.  Babinski response was deferred.       After spending a total time of  30  minutes face to face and additional time for physical and neurologic  examination, review of laboratory studies, medication levels, seizure observation by husband.  personal review of imaging studies, reports and results of other testing and review of referral information / records as far as provided in visit, I have established the following assessments:  1) I have the pleasure of  seeing Mrs. Thoen today after about a 8-year hiatus.  Since Dr. Jannifer Franklin retired he designated her to follow-up with me he has been on the she has been under very good control in terms of seizures with a combination of Lamictal and Keppra levetiracetam and lamotrigine.  She also takes a low-dose Topamax for headaches.  At this time headaches do not play a big role but this is also her summer break with a lot less stress in her life.  Her husband has noted that she snores at night however he has rarely observed her to stop breathing and that was only when in supine sleep position.  She has had weight gain and it has been very hard for her to lose weight.  She feels however that she gets enough sleep and that she can take between 1 and 3 hours of naps without affecting her ability to sleep at night.  There is no restless leg disorder endorsed.   There has been some anxiety and in the past some depression.  Her current dose is 200 mg Lamictal twice a day, and levetiracetam-Keppra 500 mg tablets 2 twice a day.  Trazodone has been used at night as a sleep inducer and was ordered just in July 2022.  He had started her on nortriptyline for her headaches just last August however I am concerned that this may interfere with memory long-term and there has been a worry due to her familial predisposition anyway.  So I discussed to leave her on trazodone but discontinue nortriptyline for headaches.    My Plan is to proceed with:  1) Keppra and Lamictal levels will be ordered and refills authorize.  2) EEG repeat ordered.   I would like to thank [place], Virgina Evener, MD and Curlene Labrum, Md [place],  [place] [id number] for allowing me to meet with and to take care of this pleasant patient.   In short, Taylor Collier is presenting with well controlled seizures.   I plan to follow up either personally or through our NP within 12 months unless the EEG shows abnormality.    Electronically signed by: Larey Seat, MD 12/28/2021 10:07 AM  [facility] Board certified by The [employer] of Sleep Medicine and Diplomate of the [employer] of Sleep Medicine. Board certified In Neurology through the [place], Fellow of the [employer] of Neurology. Medical Director of [facility].

## 2021-12-30 ENCOUNTER — Telehealth: Payer: Self-pay | Admitting: *Deleted

## 2021-12-30 LAB — LAMOTRIGINE LEVEL: Lamotrigine Lvl: 8.5 ug/mL (ref 2.0–20.0)

## 2021-12-30 LAB — LEVETIRACETAM LEVEL: Levetiracetam Lvl: 14 ug/mL (ref 10.0–40.0)

## 2021-12-30 NOTE — Telephone Encounter (Signed)
-----   Message from Melvyn Novas, MD sent at 12/30/2021 12:25 PM EDT ----- Lamotrigine is in normal range, Keppra is in normal range.

## 2021-12-30 NOTE — Telephone Encounter (Signed)
Called and LVM for pt about lab results per Dr. Oliva Bustard note. Advised she did not have to return call unless she had further questions

## 2022-01-05 ENCOUNTER — Ambulatory Visit: Payer: BC Managed Care – PPO | Admitting: Neurology

## 2022-01-05 DIAGNOSIS — G40A09 Absence epileptic syndrome, not intractable, without status epilepticus: Secondary | ICD-10-CM | POA: Diagnosis not present

## 2022-01-05 DIAGNOSIS — Z5181 Encounter for therapeutic drug level monitoring: Secondary | ICD-10-CM

## 2022-01-12 NOTE — Procedures (Signed)
    History:  46 year old woman with epilepsy   EEG classification:  Awake and asleep  Description of the recording: The background rhythms of this recording consists of a fairly well modulated medium amplitude background activity of 11 Hz. As the record progresses, the patient initially is in the waking state, but appears to enter the early stage II sleep during the recording, with rudimentary sleep spindles and vertex sharp wave activity seen. During the wakeful state, photic stimulation is performed, and no abnormal responses were seen. Hyperventilation was also performed, no abnormal response seen. No epileptiform discharges seen during this recording. There was intermittent right temporoparietal focal slowing. EKG monitor shows no evidence of cardiac rhythm abnormalities with a heart rate of 72.  Abnormality: Intermittent right temporoparietal focal slowing.  Impression: This is an abnormal EEG recording in the waking and sleeping state due to intermittent right temporoparietal focal slowing. 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      Windell Norfolk, MD [facility]

## 2022-01-13 NOTE — Progress Notes (Signed)
This is an abnormal EEG recording in the waking and sleeping state due to intermittent right temporoparietal focal slowing. 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

## 2022-01-14 ENCOUNTER — Telehealth: Payer: Self-pay | Admitting: Neurology

## 2022-01-14 NOTE — Telephone Encounter (Signed)
Called the patient and there was no answer. LVM asking for a call back

## 2022-01-14 NOTE — Telephone Encounter (Signed)
-----   Message from Melvyn Novas, MD sent at 01/13/2022 12:47 PM EDT ----- This is an abnormal EEG recording in the waking and sleeping state due to intermittent right temporoparietal focal slowing. 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

## 2022-01-20 ENCOUNTER — Telehealth: Payer: Self-pay | Admitting: *Deleted

## 2022-01-20 NOTE — Telephone Encounter (Signed)
LVM for pt about results. Advised Dr. Vickey Huger states EEG abnormal, reconfirming seizure activity and to continue on medications. She should f/u at next scheduled visit 12/30/22 and call if she has any new or worsening sx prior to then.  Asked her to call back if she has any questions.

## 2022-01-20 NOTE — Telephone Encounter (Signed)
-----   Message from Carmen Dohmeier, MD sent at 01/13/2022 12:47 PM EDT ----- This is an abnormal EEG recording in the waking and sleeping state due to intermittent right temporoparietal focal slowing. 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    

## 2022-02-15 ENCOUNTER — Other Ambulatory Visit: Payer: Self-pay | Admitting: Family

## 2022-03-31 ENCOUNTER — Telehealth: Payer: Self-pay

## 2022-03-31 NOTE — Telephone Encounter (Signed)
Noted  

## 2022-03-31 NOTE — Telephone Encounter (Signed)
Patient came in today and wanted to get her allergy injection. Patients last injection was November 13, 2021 and she received 0.62ml and she is on schedule C and Q4 at 0.64ml. I asked Wynona Canes where should patient be started at and she expressed that due to the elapsed time for the safety of the patient she would start her green vial over on Schedule A and have her wait in office for thirty minutes. I expressed that remaking her vial in office would probably be a waste as her vials expire April 30, 2022. Patient was in the lobby expressing that she had another appointment at 4pm and that she needed to leave so could I hurry up. I informed her that I was speaking with the provider as she was late in her injections. Patient expressed with an attitude that she knew it's been a while but she needed her injection. I asked her to come in the injection room to inform her that we needed to reorder her Green and red vial and have her wait her initial injection and have her agree that her vials can be made and schedule her for a week or two out so that her vials can be made. Patient rudely expressed that she didn't have time to wait and that I should told her as soon as she walked in that couldn't get an injection. I was not able to inform her of the vials needing to be remade and that her current vials were expiring in one month before rudely ran out the door.

## 2022-04-09 NOTE — Telephone Encounter (Signed)
Thanks

## 2022-04-09 NOTE — Telephone Encounter (Signed)
Agree - she has done shots at maintenance for over 3 years. With the difficulty with her getting here, I think stopping is a fine idea.   Malachi Bonds, MD [facility]

## 2022-04-14 NOTE — Telephone Encounter (Signed)
Left a message for patient to call the office in regards to this matter. Will need to see if she wants to restart with her green vial or if she wants to stop injections per Dr. Dellis Anes.

## 2022-05-19 ENCOUNTER — Ambulatory Visit: Payer: BC Managed Care – PPO | Admitting: Allergy & Immunology

## 2022-05-19 ENCOUNTER — Other Ambulatory Visit: Payer: Self-pay

## 2022-05-19 ENCOUNTER — Encounter: Payer: Self-pay | Admitting: Allergy & Immunology

## 2022-05-19 VITALS — BP 132/72 | HR 82 | Temp 98.7°F | Resp 20 | Ht 64.0 in | Wt 268.0 lb

## 2022-05-19 DIAGNOSIS — J301 Allergic rhinitis due to pollen: Secondary | ICD-10-CM | POA: Diagnosis not present

## 2022-05-19 DIAGNOSIS — J4520 Mild intermittent asthma, uncomplicated: Secondary | ICD-10-CM | POA: Diagnosis not present

## 2022-05-19 NOTE — Progress Notes (Signed)
FOLLOW UP  Date of Service/Encounter:  05/19/22   Assessment:   Seasonal allergic rhinitis due to pollen   Recurrent sinusitis  - with inadequate response to Pneumovax but improvement in infection frequency since starting allergy shots  Plan/Recommendations:   1. Seasonal allergic rhinitis due to pollen - We are going to retest you at 1:30 pm on FRIDAY. - NO ANTIHISTAMINES until after your appointment. - This will hep [place] see what you are still allergic to and what to put into your new vials. - I ordered labs, but DO NOT get them until we do testing on Friday (we may not need to get the labs at all)  - Make an appointment to start allergy shots in 2-3 weeks.  - Continue with levocetirizine 5 mg up to TWICE DAILY and Dymista two sprays per nostril twice daily (AIM for the ears on each side to avoid nose bleeds).   2. Mild intermittent asthma, uncomplicated - Lung testing looks good today. - Stop the albuterol and change to Airsupra 2 puffs every 4-6 hours as needed. - Airsupra contains albuterol AND a steroid and might help you to prevent getting as sick. - Once we get the allergy shots back up to maintenance, we can change back to albuterol only.   3. Return in about 6 months (around 11/18/2022).    Subjective:   Taylor Collier is a 46 y.o. 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  Taylor Collier is a 46 y.o. female presenting for a follow up visit.  She was last seen in March 2023 by Nehemiah Settle, one of  our esteemed nurse practitioners.  At that time, she was continued on allergy shots as well as Xyzal.  She was sneezing with certain foods and the NP recommended taking a food diary.   In the interim, she stopped shots in June 2023 and called in November to restart them. We decided to get her in to discuss going forward to see if she even needed them at all.   Asthma/Respiratory Symptom History: She does have albuterol inhaler to use as needed.  She only uses it when she has viral URIs.  The last time she used it before the start of the school year was once in the spring.  She has never been on a controller medication.  Allergic Rhinitis Symptom History: She has felt that since school started, she has been sick 3-4 times. She has been on levocetirizine nightly. She continues to get sick. She has gotten worse since stopping the shots.   She wants to restart the shots.  I did review her past testing and she was only positive to a couple of pollens.  She is open to retesting to make sure we get the most effective shots prepared for her.  Otherwise, there have been no changes to her past medical history, surgical history, family history, or social history.    Review of Systems  Constitutional: Negative.  Negative for fever, malaise/fatigue and weight loss.  HENT:  Positive for congestion and sinus pain. Negative for ear discharge and ear pain.   Eyes:  Negative for pain, discharge and redness.  Respiratory:  Positive for cough and sputum production. Negative for shortness of breath and wheezing.   Cardiovascular: Negative.  Negative for chest pain and palpitations.  Gastrointestinal:  Negative for abdominal pain and heartburn.  Skin: Negative.  Negative for itching and rash.  Neurological:  Negative for dizziness and headaches.  Endo/Heme/Allergies:  Negative for environmental allergies. Does not bruise/bleed easily.  All other systems reviewed and are negative.      Objective:   Blood  pressure 132/72, pulse 82, temperature 98.7 F (37.1 C), resp. rate 20, height 5\' 4"  (1.626 m), weight 268 lb (121.6 kg), SpO2 95 %. Body mass index is 46 kg/m.    Physical Exam Vitals reviewed.  Constitutional:      Appearance: She is well-developed.     Comments: Hoarse sounding.   HENT:     Head: Normocephalic and atraumatic.     Right Ear: Tympanic membrane, ear canal and external ear normal.     Left Ear: Tympanic membrane, ear canal and external ear normal.     Nose: No nasal deformity, septal deviation, mucosal edema or rhinorrhea.     Right Turbinates: Enlarged, swollen and pale.     Left Turbinates: Enlarged, swollen and pale.     Right Sinus: No maxillary sinus tenderness or frontal sinus tenderness.     Left Sinus: No maxillary sinus tenderness or frontal sinus tenderness.     Comments: No nasal polyps.     Mouth/Throat:     Lips: Pink.     Mouth: Mucous membranes are moist. Mucous membranes are not pale and not dry.     Pharynx: Uvula midline.     Comments: Cobblestoning in the posterior oropharynx.  Eyes:     General: Lids are normal. Allergic shiner present.        Right eye: No discharge.        Left eye: No discharge.     Conjunctiva/sclera: Conjunctivae normal.     Right eye: Right conjunctiva is not injected. No chemosis.    Left eye: Left conjunctiva is not injected. No chemosis.    Pupils: Pupils are equal, round, and reactive to light.  Cardiovascular:     Rate and Rhythm: Normal rate and regular rhythm.     Heart sounds: Normal heart sounds.  Pulmonary:     Effort: Pulmonary effort is normal. No tachypnea, accessory muscle usage or respiratory distress.     Breath sounds: Normal breath sounds. No wheezing, rhonchi or rales.     Comments: Moving air well in all lung fields. No increased work of breathing noted.  Chest:     Chest wall: No tenderness.  Lymphadenopathy:     Cervical: No cervical adenopathy.  Skin:    Coloration: Skin is not pale.      Findings: No abrasion, erythema, petechiae or rash. Rash is not papular, urticarial or vesicular.  Neurological:     Mental Status: She is alert.  Psychiatric:        Behavior: Behavior is cooperative.      Diagnostic studies:    Spirometry: results abnormal (FEV1: 3.58/128%, FVC: 1.29/37%, FEV1/FVC: 278%).    Spirometry consistent with mixed obstructive and restrictive disease. Xopenex four puffs via MDI treatment given in clinic with significant improvement in FEV1 per ATS criteria. Her spiro essentially normalized.   Allergy Studies: none  Salvatore Marvel, MD  [facility]

## 2022-05-19 NOTE — Patient Instructions (Addendum)
1. Seasonal allergic rhinitis due to pollen - We are going to retest you at 1:30 pm on FRIDAY. - NO ANTIHISTAMINES until after your appointment. - This will hep [place] see what you are still allergic to and what to put into your new vials. - I ordered labs, but DO NOT get them until we do testing on Friday (we may not need to get the labs at all)  - Make an appointment to start allergy shots in 2-3 weeks.  - Continue with levocetirizine 5 mg up to TWICE DAILY and Dymista two sprays per nostril twice daily (AIM for the ears on each side to avoid nose bleeds).   2. Mild intermittent asthma, uncomplicated - Lung testing looks good today. - Stop the albuterol and change to Airsupra 2 puffs every 4-6 hours as needed. - Airsupra contains albuterol AND a steroid and might help you to prevent getting as sick. - Once we get the allergy shots back up to maintenance, we can change back to albuterol only.   3. Return in about 6 months (around 11/18/2022).    Please inform us of any Emergency Department visits, hospitalizations, or changes in symptoms. Call us before going to the ED for breathing or allergy symptoms since we might be able to fit you in for a sick visit. Feel free to contact us anytime with any questions, problems, or concerns.  It was a pleasure to see you again today!  Websites that have reliable patient information: 1. American Academy of Asthma, Allergy, and Immunology: www.aaaai.org 2. Food Allergy Research and Education (FARE): foodallergy.org 3. Mothers of Asthmatics: http://www.asthmacommunitynetwork.org 4. American College of Allergy, Asthma, and Immunology: www.acaai.org   COVID-19 Vaccine Information can be found at: [email or web address] For questions related to vaccine distribution or appointments, please email [email or web address] or call [phone].   We realize that you might be concerned about having an allergic  reaction to the COVID19 vaccines. To help with that concern, WE ARE OFFERING THE COVID19 VACCINES IN OUR OFFICE! Ask the front desk for dates!     "Like" [place] on Facebook and Instagram for our latest updates!      A healthy democracy works best when [employer] participate! Make sure you are registered to vote! If you have moved or changed any of your contact information, you will need to get this updated before voting!  In some cases, you MAY be able to register to vote online: [email or web address]

## 2022-05-21 ENCOUNTER — Other Ambulatory Visit: Payer: Self-pay

## 2022-05-21 ENCOUNTER — Ambulatory Visit: Payer: BC Managed Care – PPO | Admitting: Allergy & Immunology

## 2022-05-21 ENCOUNTER — Encounter: Payer: Self-pay | Admitting: Allergy & Immunology

## 2022-05-21 VITALS — BP 132/72 | HR 90 | Temp 98.1°F | Resp 20 | Ht 64.0 in | Wt 268.0 lb

## 2022-05-21 DIAGNOSIS — J302 Other seasonal allergic rhinitis: Secondary | ICD-10-CM | POA: Diagnosis not present

## 2022-05-21 MED ORDER — AZELASTINE-FLUTICASONE 137-50 MCG/ACT NA SUSP: 2.0000 | Freq: Two times a day (BID) | NASAL | 5 refills | Status: DC

## 2022-05-21 NOTE — Patient Instructions (Addendum)
1. Seasonal allergic rhinitis due to pollen - Testing was positive to weed, tree, dust mites, and dog.  - You can restart your antihistamines and other medications today. - Make an appointment to start shots if you have not done so already.  - Continue with levocetirizine 5 mg up to TWICE DAILY and Dymista two sprays per nostril twice daily (AIM for the ears on each side to avoid nose bleeds).   2. Mild intermittent asthma, uncomplicated - Lung testing looked good a few days ago.  - Continue with Airsupra 2 puffs every 4-6 hours as needed. - Airsupra contains albuterol AND a steroid and might help you to prevent getting as sick. - Once we get the allergy shots back up to maintenance, we can change back to albuterol only.   3. Return in about 3 months (around 08/20/2022).    Please inform us of any Emergency Department visits, hospitalizations, or changes in symptoms. Call us before going to the ED for breathing or allergy symptoms since we might be able to fit you in for a sick visit. Feel free to contact us anytime with any questions, problems, or concerns.  It was a pleasure to see you again today!  Websites that have reliable patient information: 1. American Academy of Asthma, Allergy, and Immunology: www.aaaai.org 2. Food Allergy Research and Education (FARE): foodallergy.org 3. Mothers of Asthmatics: http://www.asthmacommunitynetwork.org 4. American College of Allergy, Asthma, and Immunology: www.acaai.org   COVID-19 Vaccine Information can be found at: [email or web address] For questions related to vaccine distribution or appointments, please email [email or web address] or call [phone].   We realize that you might be concerned about having an allergic reaction to the COVID19 vaccines. To help with that concern, WE ARE OFFERING THE COVID19 VACCINES IN OUR OFFICE! Ask the front desk for dates!     "Like" [place] on Facebook  and Instagram for our latest updates!      A healthy democracy works best when [employer] participate! Make sure you are registered to vote! If you have moved or changed any of your contact information, you will need to get this updated before voting!  In some cases, you MAY be able to register to vote online: [email or web address]        Airborne Adult Perc - 05/21/22 1423     Time Antigen Placed 1400    Allergen Manufacturer Waynette Buttery    Location Back    Number of Test 59    1. Control-Buffer 50% Glycerol Negative    2. Control-Histamine 1 mg/ml 2+    3. Albumin saline Negative    4. Bahia Negative    5. French Southern Territ[place] Negative    6. Johnson Negative    7. Kentucky Blue Negative    8. Meadow Fescue Negative    9. Perennial Rye Negative    10. Sweet Vernal Negative    11. Timothy Negative    12. Cocklebur Negative    13. Burweed Marshelder Negative    14. Ragweed, short Negative    15. Ragweed, Giant Negative    16. Plantain,  English Negative    17. Lamb's Quarters Negative    18. Sheep Sorrell Negative    19. Rough Pigweed Negative    20. Marsh Elder, Rough Negative    21. Mugwort, Common Negative    22. Ash mix Negative    23. Birch mix Negative    24. Beech American Negative    25. Box, Elder Negative    26. Cedar, red Negative  27. Cottonwood, Eastern Negative    28. Elm mix Negative    29. Hickory Negative    30. Maple mix Negative    31. Oak, Guinea-Bissau mix Negative    32. Pecan Pollen Negative    33. Pine mix Negative    34. Sycamore Eastern Negative    35. Walnut, Black Pollen Negative    36. Alternaria alternata Negative    37. Cladosporium Herbarum Negative    38. Aspergillus mix Negative    39. Penicillium mix Negative    40. Bipolaris sorokiniana (Helminthosporium) Negative    41. Drechslera spicifera (Curvularia) Negative    42. Mucor plumbeus Negative    43. Fusarium moniliforme Negative    44. Aureobasidium pullulans  (pullulara) Negative    45. Rhizopus oryzae Negative    46. Botrytis cinera Negative    47. Epicoccum nigrum Negative    48. Phoma betae Negative    49. Candida Albicans Negative    50. Trichophyton mentagrophytes Negative    51. Mite, D Farinae  5,000 AU/ml Negative    52. Mite, D Pteronyssinus  5,000 AU/ml Negative    53. Cat Hair 10,000 BAU/ml Negative    54.  Dog Epithelia Negative    55. Mixed Feathers Negative    56. Horse Epithelia Negative    57. Cockroach, German Negative    58. Mouse Negative    59. Tobacco Leaf Negative             Intradermal - 05/21/22 1423     Time Antigen Placed 1430    Allergen Manufacturer Waynette Buttery    Location Arm    Number of Test 15    Control Negative    French Southern Territories Negative    Johnson Negative    7 Grass Negative    Ragweed mix Negative    Weed mix 2+    Tree mix 1+    Mold 1 Negative    Mold 2 Negative    Mold 3 Negative    Mold 4 Negative    Cat Negative    Dog 1+    Cockroach Negative    Mite mix 3+            Reducing Pollen Exposure  The American Academy of Allergy, Asthma and Immunology suggests the following steps to reduce your exposure to pollen during allergy seasons.    Do not hang sheets or clothing out to dry; pollen may collect on these items. Do not mow lawns or spend time around freshly cut grass; mowing stirs up pollen. Keep windows closed at night.  Keep car windows closed while driving. Minimize morning activities outdoors, a time when pollen counts are usually at their highest. Stay indoors as much as possible when pollen counts or humidity is high and on windy days when pollen tends to remain in the air longer. Use air conditioning when possible.  Many air conditioners have filters that trap the pollen spores. Use a HEPA room air filter to remove pollen form the indoor air you breathe.  Control of Dust Mite Allergen    Dust mites play a major role in allergic asthma and rhinitis.  They occur in  environments with high humidity wherever human skin is found.  Dust mites absorb humidity from the atmosphere (ie, they do not drink) and feed on organic matter (including shed human and animal skin).  Dust mites are a microscopic type of insect that you cannot see with the naked eye.  High levels of  dust mites have been detected from mattresses, pillows, carpets, upholstered furniture, bed covers, clothes, soft toys and any woven material.  The principal allergen of the dust mite is found in its feces.  A gram of dust may contain 1,000 mites and 250,000 fecal particles.  Mite antigen is easily measured in the air during house cleaning activities.  Dust mites do not bite and do not cause harm to humans, other than by triggering allergies/asthma.    Ways to decrease your exposure to dust mites in your home:  Encase mattresses, box springs and pillows with a mite-impermeable barrier or cover   Wash sheets, blankets and drapes weekly in hot water (130 F) with detergent and dry them in a dryer on the hot setting.  Have the room cleaned frequently with a vacuum cleaner and a damp dust-mop.  For carpeting or rugs, vacuuming with a vacuum cleaner equipped with a high-efficiency particulate air (HEPA) filter.  The dust mite allergic individual should not be in a room which is being cleaned and should wait 1 hour after cleaning before going into the room. Do not sleep on upholstered furniture (eg, couches).   If possible removing carpeting, upholstered furniture and drapery from the home is ideal.  Horizontal blinds should be eliminated in the rooms where the person spends the most time (bedroom, study, television room).  Washable vinyl, roller-type shades are optimal. Remove all non-washable stuffed toys from the bedroom.  Wash stuffed toys weekly like sheets and blankets above.   Reduce indoor humidity to less than 50%.  Inexpensive humidity monitors can be purchased at most hardware stores.  Do not use a  humidifier as can make the problem worse and are not recommended.  Control of Dog or Cat Allergen  Avoidance is the best way to manage a dog or cat allergy. If you have a dog or cat and are allergic to dog or cats, consider removing the dog or cat from the home. If you have a dog or cat but don't want to find it a new home, or if your family wants a pet even though someone in the household is allergic, here are some strategies that may help keep symptoms at bay:  Keep the pet out of your bedroom and restrict it to only a few rooms. Be advised that keeping the dog or cat in only one room will not limit the allergens to that room. Don't pet, hug or kiss the dog or cat; if you do, wash your hands with soap and water. High-efficiency particulate air (HEPA) cleaners run continuously in a bedroom or living room can reduce allergen levels over time. Regular use of a high-efficiency vacuum cleaner or a central vacuum can reduce allergen levels. Giving your dog or cat a bath at least once a week can reduce airborne allergen.

## 2022-05-21 NOTE — Progress Notes (Unsigned)
FOLLOW UP  Date of Service/Encounter:  05/21/22   Assessment:   Perennial and seasonal allergic rhinitis (weed, tree, dust mites, and dog)   Recurrent sinusitis  - with inadequate response to Pneumovax but improvement in infection frequency since starting allergy shots    Plan/Recommendations:   1. Perennial and seasonal allergic rhinitis  - Testing was positive to weed, tree, dust mites, and dog.  - You can restart your antihistamines and other medications today. - Make an appointment to start shots if you have not done so already.  - Continue with levocetirizine 5 mg up to TWICE DAILY and Dymista two sprays per nostril twice daily (AIM for the ears on each side to avoid nose bleeds).   2. Mild intermittent asthma, uncomplicated - Lung testing looked good a few days ago.  - Continue with Airsupra 2 puffs every 4-6 hours as needed. - Airsupra contains albuterol AND a steroid and might help you to prevent getting as sick. - Once we get the allergy shots back up to maintenance, we can change back to albuterol only.   3. Return in about 3 months (around 08/20/2022).    Subjective:   Taylor Collier is a 46 y.o. 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  [name] is a 46 y.o. female presenting for {Blank single:19197::"a food challenge","a drug challenge","skin testing","a sick visit","an evaluation of ***","a follow up visit"}.  {Blank  single:19197::"Asthma/Respiratory Symptom History: ***"," "}  {Blank single:19197::"Allergic Rhinitis Symptom History: ***"," "}  {Blank single:19197::"Food Allergy Symptom History: ***"," "}  {Blank single:19197::"Skin Symptom History: ***"," "}  {Blank single:19197::"GERD Symptom History: ***"," "}  Otherwise, there have been no changes to her past medical history, surgical history, family history, or social history.    ROS     Objective:   Blood pressure 132/72, pulse 90, temperature 98.1 F (36.7 C), resp. rate 20, height 5\' 4"  (1.626 m), weight 268 lb (121.6 kg), SpO2 97 %. Body mass index is 46 kg/m.    Physical Exam   Diagnostic studies: {Blank single:19197::"none","deferred due to recent antihistamine use","labs sent instead"," "}  Spirometry: {Blank single:19197::"results normal (FEV1: ***%, FVC: ***%, FEV1/FVC: ***%)","results abnormal (FEV1: ***%, FVC: ***%, FEV1/FVC: ***%)"}.    {Blank single:19197::"Spirometry consistent with mild obstructive disease","Spirometry consistent with moderate obstructive disease","Spirometry consistent with severe obstructive disease","Spirometry consistent with possible restrictive disease","Spirometry consistent with mixed obstructive and restrictive disease","Spirometry uninterpretable due to technique","Spirometry consistent with normal pattern"}. {Blank single:19197::"Albuterol/Atrovent nebulizer","Xopenex/Atrovent nebulizer","Albuterol nebulizer","Albuterol four puffs via MDI","Xopenex four puffs via MDI"} treatment given in clinic with {Blank single:19197::"significant improvement in FEV1 per ATS criteria","significant improvement in FVC per ATS criteria","significant improvement in FEV1 and FVC per ATS criteria","improvement in FEV1, but not significant per ATS criteria","improvement in FVC, but not significant per ATS criteria","improvement in FEV1 and FVC, but not significant per ATS criteria","no improvement"}.  Allergy Studies:  {Blank single:19197::"none","labs sent instead"," "}   Airborne Adult Perc - 05/21/22 1423     Time Antigen Placed 1400  Allergen Manufacturer Greer    Location Back    Number of Test 59    1. Control-Buffer 50% Glycerol Negative    2. Control-Histamine 1 mg/ml 2+    3. Albumin saline Negative    4. Calais Negative    5. [place] Negative    6. Johnson Negative    7. Beulah Blue Negative    8. Meadow Fescue Negative    9. Perennial Rye Negative    10. Sweet Vernal Negative    11. Timothy Negative    12. Cocklebur Negative    13. Burweed Marshelder Negative    14. Ragweed, short Negative    15. Ragweed, Giant Negative    16. Plantain,  English Negative    17. Lamb's Quarters Negative    18. Sheep Sorrell Negative    19. Rough Pigweed Negative    20. Marsh Elder, Rough Negative    21. Mugwort, Common Negative    22. Ash mix Negative    23. Birch mix Negative    24. Beech American Negative    25. Box, Elder Negative    26. Cedar, red Negative    27. Cottonwood, [place] Negative    28. Elm mix Negative    29. Hickory Negative    30. Maple mix Negative    31. Oak, [place] mix Negative    32. Pecan Pollen Negative    33. Pine mix Negative    34. Sycamore Eastern Negative    35. Rodeo, Black Pollen Negative    36. Alternaria alternata Negative    37. Cladosporium Herbarum Negative    38. Aspergillus mix Negative    39. Penicillium mix Negative    40. Bipolaris sorokiniana (Helminthosporium) Negative    41. Drechslera spicifera (Curvularia) Negative    42. Mucor plumbeus Negative    43. Fusarium moniliforme Negative    44. Aureobasidium pullulans (pullulara) Negative    45. Rhizopus oryzae Negative    46. Botrytis cinera Negative    47. Epicoccum nigrum Negative    48. Phoma betae Negative    49. Candida Albicans Negative    50. Trichophyton mentagrophytes Negative    51. Mite, D Farinae  5,000 AU/ml Negative    52. Mite, D Pteronyssinus  5,000 AU/ml Negative     53. Cat Hair 10,000 BAU/ml Negative    54.  Dog Epithelia Negative    55. Mixed Feathers Negative    56. Horse Epithelia Negative    57. Cockroach, German Negative    58. Mouse Negative    59. Tobacco Leaf Negative             Intradermal - 05/21/22 1423     Time Antigen Placed 1430    Allergen Manufacturer Lavella Hammock    Location Arm    Number of Test 15    Control Negative    Guatem[place] Negative    Johnson Negative    7 Grass Negative    Ragweed mix Negative    Weed mix 2+    Tree mix 1+    Mold 1 Negative    Mold 2 Negative    Mold 3 Negative    Mold 4 Negative    Cat Negative    Dog 1+    Cockroach Negative    Mite mix 3+             {Blank single:19197::"Allergy testing results were read and interpreted by myself, documented by clinical staff."," "}  Salvatore Marvel, MD  [facility]

## 2022-05-24 ENCOUNTER — Encounter: Payer: Self-pay | Admitting: Allergy & Immunology

## 2022-05-25 NOTE — Progress Notes (Signed)
Aeroallergen Immunotherapy  Ordering Provider: Dr. Salvatore Marvel  Patient Details Name: CHARLINA DWIGHT MRN: [id number] Date of Birth: Mar 28, 1976  Order 1 of 1  Vial Label: W/T/DM/D  0.2 ml (Volume)  1:20 Concentration -- Cocklebur 0.2 ml (Volume)  1:20 Concentration -- Burweed Marshelder 0.5 ml (Volume)  1:20 Concentration -- Weed Mix* 0.5 ml (Volume)  1:20 Concentration -- Eastern 10 Tree Mix (also Sweet Gum) 0.2 ml (Volume)  1:20 Concentration -- Ash mix* 0.8 ml (Volume)  1:10 Concentration -- Dog Epithelia 0.8 ml (Volume)   AU Concentration -- Mite Mix (DF 5,000 & DP 5,000)   3.2  ml Extract Subtotal 1.8  ml Diluent 5.0  ml Maintenance Total  Schedule:  B  Blue Vial (1:100,000): Schedule B (6 doses) Yellow Vial (1:10,000): Schedule B (6 doses) Green Vial (1:1,000): Schedule B (6 doses) Red Vial (1:100): Schedule B (6 doses)  Special Instructions: Ok to come twice weekly, if desired.

## 2022-05-25 NOTE — Progress Notes (Signed)
VIALS EXP 05-26-23 

## 2022-05-26 DIAGNOSIS — J3089 Other allergic rhinitis: Secondary | ICD-10-CM | POA: Diagnosis not present

## 2022-06-16 ENCOUNTER — Ambulatory Visit (INDEPENDENT_AMBULATORY_CARE_PROVIDER_SITE_OTHER): Payer: BC Managed Care – PPO

## 2022-06-16 ENCOUNTER — Encounter: Payer: Self-pay | Admitting: Allergy & Immunology

## 2022-06-16 DIAGNOSIS — J309 Allergic rhinitis, unspecified: Secondary | ICD-10-CM | POA: Diagnosis not present

## 2022-06-16 NOTE — Progress Notes (Signed)
Immunotherapy   Patient Details  Name: Taylor Collier MRN: [id number] Date of Birth: June 07, 1975  06/16/2022  Etheleen Nicks started injections for  weeds, trees, dust mites, and dogs.  Following schedule: B  Frequency:2 times per week Epi-Pen:Epi-Pen Available  Consent signed previously and patient instructions given. Patient waited in the lobby for a few minutes before going to sit in her car for the duration of her thirty minutes. Patient came back to have her arm looked out and she was fine.    Julius Bowels 06/16/2022, 5:08 PM

## 2022-06-23 ENCOUNTER — Ambulatory Visit (INDEPENDENT_AMBULATORY_CARE_PROVIDER_SITE_OTHER): Payer: BC Managed Care – PPO

## 2022-06-23 DIAGNOSIS — J309 Allergic rhinitis, unspecified: Secondary | ICD-10-CM

## 2022-06-25 ENCOUNTER — Ambulatory Visit (INDEPENDENT_AMBULATORY_CARE_PROVIDER_SITE_OTHER): Payer: BC Managed Care – PPO

## 2022-06-25 DIAGNOSIS — J309 Allergic rhinitis, unspecified: Secondary | ICD-10-CM

## 2022-06-30 ENCOUNTER — Ambulatory Visit (INDEPENDENT_AMBULATORY_CARE_PROVIDER_SITE_OTHER): Payer: BC Managed Care – PPO

## 2022-06-30 DIAGNOSIS — J309 Allergic rhinitis, unspecified: Secondary | ICD-10-CM

## 2022-07-02 ENCOUNTER — Ambulatory Visit (INDEPENDENT_AMBULATORY_CARE_PROVIDER_SITE_OTHER): Payer: BC Managed Care – PPO

## 2022-07-02 DIAGNOSIS — J309 Allergic rhinitis, unspecified: Secondary | ICD-10-CM | POA: Diagnosis not present

## 2022-07-07 ENCOUNTER — Ambulatory Visit (INDEPENDENT_AMBULATORY_CARE_PROVIDER_SITE_OTHER): Payer: BC Managed Care – PPO

## 2022-07-07 DIAGNOSIS — J309 Allergic rhinitis, unspecified: Secondary | ICD-10-CM

## 2022-07-09 ENCOUNTER — Ambulatory Visit (INDEPENDENT_AMBULATORY_CARE_PROVIDER_SITE_OTHER): Payer: BC Managed Care – PPO

## 2022-07-09 DIAGNOSIS — J309 Allergic rhinitis, unspecified: Secondary | ICD-10-CM | POA: Diagnosis not present

## 2022-07-14 ENCOUNTER — Ambulatory Visit (INDEPENDENT_AMBULATORY_CARE_PROVIDER_SITE_OTHER): Payer: BC Managed Care – PPO

## 2022-07-14 DIAGNOSIS — J309 Allergic rhinitis, unspecified: Secondary | ICD-10-CM | POA: Diagnosis not present

## 2022-07-21 ENCOUNTER — Ambulatory Visit (INDEPENDENT_AMBULATORY_CARE_PROVIDER_SITE_OTHER): Payer: BC Managed Care – PPO

## 2022-07-21 DIAGNOSIS — J309 Allergic rhinitis, unspecified: Secondary | ICD-10-CM | POA: Diagnosis not present

## 2022-07-23 ENCOUNTER — Ambulatory Visit (INDEPENDENT_AMBULATORY_CARE_PROVIDER_SITE_OTHER): Payer: BC Managed Care – PPO

## 2022-07-23 DIAGNOSIS — J309 Allergic rhinitis, unspecified: Secondary | ICD-10-CM

## 2022-07-28 ENCOUNTER — Ambulatory Visit (INDEPENDENT_AMBULATORY_CARE_PROVIDER_SITE_OTHER): Payer: BC Managed Care – PPO

## 2022-07-28 DIAGNOSIS — J309 Allergic rhinitis, unspecified: Secondary | ICD-10-CM

## 2022-07-30 ENCOUNTER — Ambulatory Visit (INDEPENDENT_AMBULATORY_CARE_PROVIDER_SITE_OTHER): Payer: BC Managed Care – PPO

## 2022-07-30 DIAGNOSIS — J309 Allergic rhinitis, unspecified: Secondary | ICD-10-CM | POA: Diagnosis not present

## 2022-08-04 ENCOUNTER — Ambulatory Visit (INDEPENDENT_AMBULATORY_CARE_PROVIDER_SITE_OTHER): Payer: BC Managed Care – PPO

## 2022-08-04 DIAGNOSIS — J309 Allergic rhinitis, unspecified: Secondary | ICD-10-CM | POA: Diagnosis not present

## 2022-08-06 ENCOUNTER — Ambulatory Visit (INDEPENDENT_AMBULATORY_CARE_PROVIDER_SITE_OTHER): Payer: BC Managed Care – PPO | Admitting: *Deleted

## 2022-08-06 DIAGNOSIS — J309 Allergic rhinitis, unspecified: Secondary | ICD-10-CM | POA: Diagnosis not present

## 2022-08-11 ENCOUNTER — Ambulatory Visit (INDEPENDENT_AMBULATORY_CARE_PROVIDER_SITE_OTHER): Payer: BC Managed Care – PPO

## 2022-08-11 DIAGNOSIS — J309 Allergic rhinitis, unspecified: Secondary | ICD-10-CM

## 2022-08-17 ENCOUNTER — Other Ambulatory Visit: Payer: Self-pay | Admitting: Family

## 2022-08-18 ENCOUNTER — Ambulatory Visit (INDEPENDENT_AMBULATORY_CARE_PROVIDER_SITE_OTHER): Payer: BC Managed Care – PPO

## 2022-08-18 DIAGNOSIS — J309 Allergic rhinitis, unspecified: Secondary | ICD-10-CM

## 2022-08-25 ENCOUNTER — Ambulatory Visit: Payer: BC Managed Care – PPO | Admitting: Allergy & Immunology

## 2022-08-25 ENCOUNTER — Other Ambulatory Visit: Payer: Self-pay

## 2022-08-25 ENCOUNTER — Encounter: Payer: Self-pay | Admitting: Allergy & Immunology

## 2022-08-25 VITALS — BP 160/88 | HR 94 | Temp 98.4°F | Resp 18 | Wt 265.1 lb

## 2022-08-25 DIAGNOSIS — H6692 Otitis media, unspecified, left ear: Secondary | ICD-10-CM | POA: Diagnosis not present

## 2022-08-25 DIAGNOSIS — J309 Allergic rhinitis, unspecified: Secondary | ICD-10-CM | POA: Diagnosis not present

## 2022-08-25 DIAGNOSIS — J302 Other seasonal allergic rhinitis: Secondary | ICD-10-CM

## 2022-08-25 MED ORDER — CEFDINIR 300 MG PO CAPS: 300.0000 mg | ORAL_CAPSULE | Freq: Two times a day (BID) | ORAL | 0 refills | Status: AC

## 2022-08-25 NOTE — Patient Instructions (Addendum)
1. Seasonal allergic rhinitis due to pollen - Continue with the allergy shots at the same schedule.  - Continue with levocetirizine 5 mg up to TWICE DAILY and Dymista two sprays per nostril twice daily (AIM for the ears on each side to avoid nose bleeds).  - Start cefdinir 300mg  twice daily for ten days. - Call us if you need any Diflucan.   2. Mild intermittent asthma, uncomplicated - Lung testing looked good a few days ago.  - Continue with Airsupra 2 puffs every 4-6 hours as needed.  3. Follow up as scheduled.    Please inform us of any Emergency Department visits, hospitalizations, or changes in symptoms. Call us before going to the ED for breathing or allergy symptoms since we might be able to fit you in for a sick visit. Feel free to contact us anytime with any questions, problems, or concerns.  It was a pleasure to see you again today!  Websites that have reliable patient information: 1. American Academy of Asthma, Allergy, and Immunology: www.aaaai.org 2. Food Allergy Research and Education (FARE): foodallergy.org 3. Mothers of Asthmatics: http://www.asthmacommunitynetwork.org 4. American College of Allergy, Asthma, and Immunology: www.acaai.org   COVID-19 Vaccine Information can be found at: [email or web address].[email or web address].uk For questions related to vaccine distribution or appointments, please email [email or web address] or call [phone].   We realize that you might be concerned about having an allergic reaction to the COVID19 vaccines. To help with that concern, WE ARE OFFERING THE COVID19 VACCINES IN OUR OFFICE! Ask the front desk for dates!     "Like" [place] on Facebook and Instagram for our latest updates!      A healthy democracy works best when [employer] participate! Make sure you are registered to vote! If you have moved or changed any of your contact information, you will need to get this updated before voting!  In  some cases, you MAY be able to register to vote online: [email or web address]

## 2022-08-25 NOTE — Progress Notes (Signed)
FOLLOW UP  Date of Service/Encounter:  08/25/22   Assessment:   Perennial and seasonal allergic rhinitis (weed, tree, dust mites, and dog)   Recurrent sinusitis  - with inadequate response to Pneumovax but improvement in infection frequency since starting allergy shots  Elevated blood pressure - likely related to Sudafed and prednisone use   Plan/Recommendations:   1. Seasonal allergic rhinitis due to pollen - Continue with the allergy shots at the same schedule.  - Continue with levocetirizine 5 mg up to TWICE DAILY and Dymista two sprays per nostril twice daily (AIM for the ears on each side to avoid nose bleeds).  - Start cefdinir 300mg  twice daily for ten days. - Call us if you need any Diflucan.   2. Mild intermittent asthma, uncomplicated - Lung testing looked good a few days ago.  - Continue with Airsupra 2 puffs every 4-6 hours as needed.  3. Follow up as scheduled.   Subjective:   Taylor Collier is a 47 y.o. female presenting today for follow up of  Chief Complaint  Patient presents with   Follow-up    Has been having sinus/ear issues. Wants to know if shots and allergy meds are effective enough.     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  Taylor Collier is a 47 y.o. female presenting for a sick visit.  She was last seen in December 2023.  At that time, he had testing that was positive to weeds, trees, dust mite, and dog.  She decided to remix her allergy shots and start those again.  We continue with levocetirizine up to twice a day and Dymista.  Her asthma was under good control with AirSupra 2 puffs every 4-6 hours as  needed.  Since the last visit, she has mostly done well.   Allergic Rhinitis Symptom History: She reports that she felt that her ear was infected, but she was told to take Sudafed. No antibiotics were given. She did this for a couple of days and her ear was still full. She could not hear and she was placed on prednisone.  She did not feel that the prednisone helped at all. She denies fevers at all.  She has been consistent with her shots and is now in the [employer]. She has not felt a ton of relief right now, but she is tolerating them without a problem.   Taylor Collier is on allergen immunotherapy. She receives one injection. Immunotherapy script #1 contains trees, weeds, dust mites, and dog. She currently receives 0.39mL of the GREEN vial (1/1,000). She started shots January of 2024 and not yet reached maintenance.  Otherwise, there have been no changes to her past medical history, surgical history, family history, or social history.    Review of Systems  Constitutional: Negative.  Negative for fever, malaise/fatigue and weight loss.  HENT:  Positive for congestion, ear discharge, ear pain and sinus pain.   Eyes:  Negative for pain, discharge and redness.  Respiratory:  Negative for cough, sputum production, shortness of breath and wheezing.   Cardiovascular: Negative.  Negative for chest pain and palpitations.  Gastrointestinal:  Negative for abdominal pain and heartburn.  Skin:  Negative.  Negative for itching and rash.  Neurological:  Negative for dizziness and headaches.  Endo/Heme/Allergies:  Negative for environmental allergies. Does not bruise/bleed easily.  All other systems reviewed and are negative.      Objective:   Blood pressure (!) 160/88, pulse 94, temperature 98.4 F (36.9 C), resp. rate 18, weight 265 lb 2 oz (120.3 kg), SpO2 98 %. Body mass index is 45.51 kg/m.    Physical Exam Vitals reviewed.  Constitutional:      Appearance: She is well-developed.      Comments: Hoarse sounding.   HENT:     Head: Normocephalic and atraumatic.     Right Ear: Tympanic membrane, ear canal and external ear normal.     Left Ear: Ear canal and external ear normal. A middle ear effusion is present. Tympanic membrane is injected and erythematous.     Nose: No nasal deformity, septal deviation, mucosal edema or rhinorrhea.     Right Turbinates: Enlarged, swollen and pale.     Left Turbinates: Enlarged, swollen and pale.     Right Sinus: No maxillary sinus tenderness or frontal sinus tenderness.     Left Sinus: No maxillary sinus tenderness or frontal sinus tenderness.     Comments: No nasal polyps.     Mouth/Throat:     Lips: Pink.     Mouth: Mucous membranes are moist. Mucous membranes are not pale and not dry.     Pharynx: Uvula midline.     Comments: Cobblestoning in the posterior oropharynx.  Eyes:     General: Lids are normal. Allergic shiner present.        Right eye: No discharge.        Left eye: No discharge.     Conjunctiva/sclera: Conjunctivae normal.     Right eye: Right conjunctiva is not injected. No chemosis.    Left eye: Left conjunctiva is not injected. No chemosis.    Pupils: Pupils are equal, round, and reactive to light.  Cardiovascular:     Rate and Rhythm: Normal rate and regular rhythm.     Heart sounds: Normal heart sounds.  Pulmonary:     Effort: Pulmonary effort is normal. No tachypnea, accessory muscle usage or respiratory distress.     Breath sounds: Normal breath sounds. No wheezing, rhonchi or rales.     Comments: Moving air well in all lung fields. No increased work of breathing noted.  Chest:     Chest wall: No tenderness.  Lymphadenopathy:     Cervical: No cervical adenopathy.  Skin:    Coloration: Skin is not pale.     Findings: No abrasion, erythema, petechiae or rash. Rash is not papular, urticarial or vesicular.  Neurological:     Mental Status: She is alert.  Psychiatric:        Behavior: Behavior is  cooperative.      Diagnostic studies: none       Salvatore Marvel, MD  [facility]

## 2022-08-27 ENCOUNTER — Ambulatory Visit (INDEPENDENT_AMBULATORY_CARE_PROVIDER_SITE_OTHER): Payer: BC Managed Care – PPO

## 2022-08-27 DIAGNOSIS — J309 Allergic rhinitis, unspecified: Secondary | ICD-10-CM

## 2022-09-03 ENCOUNTER — Ambulatory Visit (INDEPENDENT_AMBULATORY_CARE_PROVIDER_SITE_OTHER): Payer: BC Managed Care – PPO

## 2022-09-03 DIAGNOSIS — J309 Allergic rhinitis, unspecified: Secondary | ICD-10-CM | POA: Diagnosis not present

## 2022-09-07 ENCOUNTER — Ambulatory Visit: Payer: BC Managed Care – PPO | Admitting: Neurology

## 2022-09-07 ENCOUNTER — Encounter: Payer: Self-pay | Admitting: Neurology

## 2022-09-07 VITALS — BP 133/81 | HR 78 | Ht 64.0 in | Wt 269.2 lb

## 2022-09-07 DIAGNOSIS — R0683 Snoring: Secondary | ICD-10-CM

## 2022-09-07 DIAGNOSIS — Z5181 Encounter for therapeutic drug level monitoring: Secondary | ICD-10-CM

## 2022-09-07 DIAGNOSIS — G40A09 Absence epileptic syndrome, not intractable, without status epilepticus: Secondary | ICD-10-CM | POA: Diagnosis not present

## 2022-09-07 DIAGNOSIS — F40240 Claustrophobia: Secondary | ICD-10-CM | POA: Diagnosis not present

## 2022-09-07 MED ORDER — LEVETIRACETAM 750 MG PO TABS: 750.0000 mg | ORAL_TABLET | Freq: Two times a day (BID) | ORAL | 6 refills | Status: DC

## 2022-09-07 MED ORDER — ALPRAZOLAM 1 MG PO TABS
1.0000 mg | ORAL_TABLET | Freq: Every evening | ORAL | 0 refills | Status: DC | PRN
Start: 2022-09-07 — End: 2022-12-06

## 2022-09-07 NOTE — Patient Instructions (Addendum)
ASSESSMENT AND PLAN 47 y.o. year old female  former patient of Dr Anne Hahn, TOC in 2023-  here with:    1) Amnestic spells, confusion in post ictum-  repetitively asking the same questions.   Most likely related to seizure activity, she has been treated for Epilepsy by Dr Anne Hahn since 06-2008. She has been very anxious , tearful, and advised me that she is claustrophobic and afraid of a new MRI. I will order an MRI and an EMU stay.   2) Husband stated she snores and a has apnea, will re-check by HST.  3) weight loss is highly recommended. This will help with snoring and possible apnea and also is supporting long term health goals.   I will refill Keppra,but increased form  500 mg bid. to 750 mg bid po.  I will refill Lamictal 200 mg bid    I plan to follow up either personally or through our NP within 4 months.   Levetiracetam Tablets What is this medication? LEVETIRACETAM (lee ve tye RA se tam) prevents and controls seizures in people with epilepsy. It works by calming overactive nerves in your body. This medicine may be used for other purposes; ask your health care provider or pharmacist if you have questions. COMMON BRAND NAME(S): Keppra, Roweepra What should I tell my care team before I take this medication? They need to know if you have any of these conditions: Kidney disease Suicidal thoughts, plans, or attempt by you or a family member An unusual or allergic reaction to levetiracetam, other medications, foods, dyes, or preservatives Pregnant or trying to get pregnant Breast-feeding How should I use this medication? Take this medication by mouth with a glass of water. Follow the directions on the prescription label. Swallow the tablets whole. Do not crush or chew this medication. You may take this medication with or without food. Take your doses at regular intervals. Do not take your medication more often than directed. Do not stop taking this medication or any of your seizure  medications unless instructed by your care team. Stopping your medication suddenly can increase your seizures or their severity. A special MedGuide will be given to you by the pharmacist with each prescription and refill. Be sure to read this information carefully each time. Contact your care team about the use of this medication in children. While this medication may be prescribed for children as young as 44 years of age for selected conditions, precautions do apply. Overdosage: If you think you have taken too much of this medicine contact a poison control center or emergency room at once. NOTE: This medicine is only for you. Do not share this medicine with others. What if I miss a dose? If you miss a dose, take it as soon as you can. If it is almost time for your next dose, take only that dose. Do not take double or extra doses. What may interact with this medication? This medication may interact with the following: Carbamazepine Colesevelam Probenecid Sevelamer This list may not describe all possible interactions. Give your health care provider a list of all the medicines, herbs, non-prescription drugs, or dietary supplements you use. Also tell them if you smoke, drink alcohol, or use illegal drugs. Some items may interact with your medicine. What should I watch for while using this medication? Visit your care team for a regular check on your progress. Wear a medical identification bracelet or chain to say you have epilepsy, and carry a card that lists all your medications.  This medication may cause serious skin reactions. They can happen weeks to months after starting the medication. Contact your care team right away if you notice fevers or flu-like symptoms with a rash. The rash may be red or purple and then turn into blisters or peeling of the skin. You may also notice a red rash with swelling of the face, lips, or lymph nodes in your neck or under your arms. It is important to take this  medication exactly as instructed by your care team. When first starting treatment, your dose may need to be adjusted. It may take weeks or months before your dose is stable. You should contact your care team if your seizures get worse or if you have any new types of seizures. This medication may affect your coordination, reaction time, or judgment. Do not drive or operate machinery until you know how this medication affects you. Sit up or stand slowly to reduce the risk of dizzy or fainting spells. Drinking alcohol with this medication can increase the risk of these side effects. This medication may cause thoughts of suicide or depression. This includes sudden changes in mood, behaviors, or thoughts. These changes can happen at any time but are more common in the beginning of treatment or after a change in dose. Call your care team right away if you experience these thoughts or worsening depression. If you become pregnant while using this medication, you may enroll in the [place] American Antiepileptic Drug Pregnancy Registry by calling [phone]. This registry collects information about the safety of antiepileptic medication use during pregnancy. What side effects may I notice from receiving this medication? Side effects that you should report to your care team as soon as possible: Allergic reactions or angioedema--skin rash, itching or hives, swelling of the face, eyes, lips, tongue, arms, or legs, trouble swallowing or breathing Increase in blood pressure in children Infection--fever, chills, cough, or sore throat Loss of balance or coordination Low red blood cell level--unusual weakness or fatigue, dizziness, headache, trouble breathing Mood and behavior changes--anxiety, nervousness, confusion, hallucinations, irritability, hostility, thoughts of suicide or self-harm, worsening mood, feelings of depression Rash, fever, and swollen lymph nodes Redness, swelling, and blistering of the skin over  hands and feet Trouble walking Unusual bruising or bleeding Unusual weakness or fatigue Side effects that usually do not require medical attention (report these to your care team if they continue or are bothersome): Dizziness Drowsiness Fatigue Irritability Loss of appetite This list may not describe all possible side effects. Call your doctor for medical advice about side effects. You may report side effects to FDA at 1-800-FDA-1088. Where should I keep my medication? Keep out of reach of children. Store at room temperature between 15 and 30 degrees C (59 and 86 degrees F). Throw away any unused medication after the expiration date. NOTE: This sheet is a summary. It may not cover all possible information. If you have questions about this medicine, talk to your doctor, pharmacist, or health care provider.  2023 Elsevier/Gold Standard (2004-07-14 00:00:00)

## 2022-09-07 NOTE — Addendum Note (Signed)
Addended by: Melvyn Novas on: 09/07/2022 11:41 AM   Modules accepted: Orders

## 2022-09-07 NOTE — Progress Notes (Signed)
Provider:  Melvyn Novas, MD  Primary Care Physician:  Juliette Alcide, MD [address] [place] [address]     Referring Provider: Juliette Alcide, Md [address] [place],  [place] [address]          Chief Complaint according to patient   Patient presents with:     New Patient (Initial Visit)           HISTORY OF PRESENT ILLNESS:  Taylor Collier is a 48 y.o. female patient who is here for revisit 09/07/2022 for   Dr. Anne Hahn pt. Pt reports no sz like activity. Husband does report occasional moments where pt will zone out and not remember things. She also reports seeing bright lights coming from certain places. No missed dose but, dose have times where she takes it at a later time.  Pt reports doing well on nortriptyline. Migraine have been stable.  Teaches elementary school. Daily naps, long hours.      Chief concern according to patient :  my husband has noted more frequent spells,  I took a shower and went to the bedroom and I fell : she sat up against the wall- husband spoke to her and she said " I am not on the floor" .  Another spell happened in a restaurant or diner- When she put the tip on the table twice and asked 3 times if she had to pay at the register. Each time her husband had answered her question and she had not memorized the information.   Acting differently, confused, repetitive questions the same thing.   47 year old woman with epilepsy    EEG classification:  Awake and asleep   Description of the recording: The background rhythms of this recording consists of a fairly well modulated medium amplitude background activity of 11 Hz. As the record progresses, the patient initially is in the waking state, but appears to enter the early stage II sleep during the recording, with rudimentary sleep spindles and vertex sharp wave activity seen. During the wakeful state, photic stimulation is performed, and no abnormal responses were seen. Hyperventilation was also  performed, no abnormal response seen. No epileptiform discharges seen during this recording. There was intermittent right temporoparietal focal slowing. EKG monitor shows no evidence of cardiac rhythm abnormalities with a heart rate of 72.   Abnormality: Intermittent right temporoparietal focal slowing.   Impression: This is an abnormal EEG recording in the waking and sleeping state due to intermittent right temporoparietal focal slowing. 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     Windell Norfolk, MD [facility]            Review of Systems: Out of a complete 14 system review, the patient complains of only the following symptoms, and all other reviewed systems are negative.:    Spells , affecting memory, abnormal EEG.  Fatigue, sleepiness , loud snoring, obesity.  Claustrophobia.    How likely are you to doze in the following situations: 0 = not likely, 1 = slight chance, 2 = moderate chance, 3 = high chance   Sitting and Reading? Watching Television? Sitting inactive in a public place (theater or meeting)? As a passenger in a car for an hour without a break? Lying down in the afternoon when circumstances permit? Sitting and talking to someone? Sitting quietly after lunch without alcohol? In a car, while stopped  for a few minutes in traffic?   Total = didn't answer / 24 points   FSS endorsed at didn't answer/ 63 points.   Social History   Socioeconomic History   Marital status: Married    Spouse name: Not on file   Number of children: 2   Years of education: MA   Highest education level: Not on file  Occupational History   Occupation: teacher  Tobacco Use   Smoking status: Former    Packs/day: .25    Types: Cigarettes    Start date: 09/23/1994    Quit date: 09/22/1997    Years since quitting: 24.9   Smokeless tobacco: Never   Tobacco comments:    quit 2003  Vaping Use   Vaping Use: Never  used  Substance and Sexual Activity   Alcohol use: No    Comment: on occasion   Drug use: Yes    Types: Psilocybin   Sexual activity: Yes  Other Topics Concern   Not on file  Social History Narrative   Pt lives at home, married   Patient drinks 2-3 cups of caffeine daily.   Patient is right handed.   Social Determinants of Health   Financial Resource Strain: Not on file  Food Insecurity: Not on file  Transportation Needs: Not on file  Physical Activity: Not on file  Stress: Not on file  Social Connections: Not on file    Family History  Problem Relation Age of Onset   Colon cancer Mother    Aneurysm Father    Breast cancer Sister    Diabetes Paternal Aunt    Alzheimer's disease Maternal Grandmother    Uterine cancer Maternal Grandmother     Past Medical History:  Diagnosis Date   Anxiety    Apnea    witnessed with snoring   Chronic insomnia 12/24/2014   Common migraine with intractable migraine 12/26/2019   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     albuterol (VENTOLIN HFA) 108 (90 Base) MCG/ACT inhaler Inhale 2 puffs into the lungs every 4 (four) hours as needed.     Azelastine-Fluticasone (DYMISTA) 137-50 MCG/ACT SUSP Place 2 sprays into the nose in the morning and at bedtime. INSTILL TWO SPRAYS INTO BOTH NOSTRILS DAILY 23 g 5   Biotin 5000 MCG TABS Take 1 tablet by mouth daily.     CALCIUM PO Take 600 mg by mouth daily.     chlorthalidone (HYGROTON) 25 MG tablet TAKE ONE TABLET BY MOUTH EVERY DAY - PT. NEEDS OFFICE VISIT  7 tablet 0   FLOWFLEX COVID-19 AG HOME TEST KIT See admin instructions.     folic acid (FOLVITE) 1 MG tablet TAKE 1 TABLET BY MOUTH TWICE DAILY 60 tablet 12   labetalol (NORMODYNE) 200 MG tablet TAKE 1 TABLET BY MOUTH TWICE DAILY 4 tablet 0   lamoTRIgine (LAMICTAL) 200 MG tablet Take 1 tablet (200 mg total) by mouth 2 (two) times daily. 180 tablet 3   levETIRAcetam (KEPPRA) 500  MG tablet Take 1 tablet (500 mg total) by mouth 2 (two) times daily. 180 tablet 3   levocetirizine (XYZAL) 5 MG tablet TAKE 1 TABLET BY MOUTH DAILY AS NEEDED FOR runny nose 30 tablet 5   Multiple Vitamin (MULTIVITAMIN) tablet Take 1 tablet by mouth daily.     traZODone (DESYREL) 150 MG tablet Take 1 tablet (150 mg total) by mouth at bedtime. 90 tablet 3   UNABLE TO FIND Allergy Shot 1 per week     EPINEPHrine 0.3 mg/0.3 mL IJ SOAJ injection Inject one dose intramuscularly for allergic reaction. May repeat one dose if needed after 5-15 minutes. Proceed to the ER (Patient not taking: Reported on 05/19/2022) 1 each 1   No current facility-administered medications on file prior to visit.    Allergies  Allergen Reactions   Amoxicillin-Pot Clavulanate Rash   Penicillins Rash     DIAGNOSTIC DATA (LABS, IMAGING, TESTING) - I reviewed patient records, labs, notes, testing and imaging myself where available.  Lab Results  Component Value Date   WBC 8.4 12/25/2020   HGB 13.1 12/25/2020   HCT 38.6 12/25/2020   MCV 88 12/25/2020   PLT 340 12/25/2020      Component Value Date/Time   NA 137 12/25/2020 1005   K 3.9 12/25/2020 1005   CL 97 12/25/2020 1005   CO2 22 12/25/2020 1005   GLUCOSE 87 12/25/2020 1005   GLUCOSE 138 (H) 12/18/2009 2021   BUN 14 12/25/2020 1005   CREATININE 0.74 12/25/2020 1005   CALCIUM 10.3 (H) 12/25/2020 1005   PROT 7.1 12/25/2020 1005   ALBUMIN 4.6 12/25/2020 1005   AST 24 12/25/2020 1005   ALT 33 (H) 12/25/2020 1005   ALKPHOS 83 12/25/2020 1005   BILITOT 0.5 12/25/2020 1005   GFRNONAA  96 12/26/2019 1024   GFRAA 111 12/26/2019 1024   No results found for: "CHOL", "HDL", "LDLCALC", "LDLDIRECT", "TRIG", "CHOLHDL" No results found for: "HGBA1C" No results found for: "VITAMINB12" No results found for: "TSH"  PHYSICAL EXAM:  Today's Vitals   09/07/22 1037  BP: 133/81  Pulse: 78  Weight: 269 lb 3.2 oz (122.1 kg)  Height:  (1.626 m)   Body mass index is 46.21 kg/m.   Wt Readings from Last 3 Encounters:  09/07/22 269 lb 3.2 oz (122.1 kg)  08/25/22 265 lb 2 oz (120.3 kg)  05/21/22 268 lb (121.6 kg)     Ht Readings from Last 3 Encounters:  09/07/22  (1.626 m)  05/21/22  (1.626 m)  05/19/22  (1.626 m)      General: The patient is awake, alert and appears not in acute distress. The patient is well groomed. Head: Normocephalic, atraumatic. Neck is supple. Mallampati 1,  neck circumference:17 inches .  Nasal airflow barley patent.  Retrognathia is not seen.  Dental status: biological     The patient is alert and cooperative at the time of the examination. The patient is moderately to markedly obese.   Skin: No significant peripheral edema is noted.   Neurologic Exam  Mental status: The patient is tearful, concerned- reported  amnestic events.  alert and oriented x 3 at the time of the examination. The patient has apparent normal remote memory, with an apparently normal attention span and concentration ability.     Cranial nerves: Facial symmetry is present. Speech is normal, no aphasia or dysarthria is noted. Extraocular movements are full. Visual fields are full.   Motor: The patient has good strength  in all 4 extremities.   Sensory examination: Soft touch sensation is symmetric on the face, arms, and legs.   Coordination: The patient has good finger-nose-finger and heel-to-shin bilaterally.   Gait and station: The patient has a normal gait. Tandem gait is normal. Romberg is negative. No drift is seen.   Reflexes: Deep tendon  reflexes are symmetric.     Cardiovascular:  Regular rate and cardiac rhythm by pulse,  without distended neck veins. Respiratory: Lungs are clear to auscultation.  Skin:  Without evidence of ankle edema, or rash. Trunk: The patient's posture is erect.    ASSESSMENT AND PLAN 47 y.o. year old female  former patient of Dr Anne Hahn, TOC in 2023-  here with:    1) Amnestic spells, confusion in post ictum-  repetitively asking the same questions.   Most likely related to seizure activity, she has been treated for Epilepsy by Dr Anne Hahn since 06-2008. She has been very anxious , tearful, and advised me that she is claustrophobic and afraid of a new MRI. I will order an MRI and an EMU stay.   2) Husband stated she snores and a has apnea, will re-check by HST.  3) weight loss is highly recommended. This will help with snoring and possible apnea and also is supporting long term health goals.   I will refill Keppra,but increased form  500 mg bid. to 750 mg bid po.  I will refill Lamictal 200 mg bid    I plan to follow up either personally or through our NP within 4 months. The patient was advised not to drive until we have found a cause and treatment for these spells.   I would like to thank Burdine, Ananias Pilgrim, MD for allowing me to meet with and to take care of this pleasant patient.    After spending a total time of  40  minutes face to face and additional time for physical and neurologic examination, review of laboratory studies,  personal review of imaging studies, reports and results of other testing and review of referral information / records as far as provided in visit,   Electronically signed by: Melvyn Novas, MD 09/07/2022 11:09 AM  [facility] Board certified by The [employer] of Sleep Medicine and Diplomate of the [employer] of Sleep Medicine. Board certified In Neurology through the ABPN, Fellow of the [employer] of  Neurology. Medical Director of [facility].

## 2022-09-08 ENCOUNTER — Encounter: Payer: Self-pay | Admitting: Neurology

## 2022-09-08 ENCOUNTER — Ambulatory Visit (INDEPENDENT_AMBULATORY_CARE_PROVIDER_SITE_OTHER): Payer: BC Managed Care – PPO

## 2022-09-08 DIAGNOSIS — J309 Allergic rhinitis, unspecified: Secondary | ICD-10-CM

## 2022-09-09 ENCOUNTER — Telehealth: Payer: Self-pay | Admitting: Neurology

## 2022-09-09 NOTE — Telephone Encounter (Signed)
09/08/22 left VM [place] 09/07/22 [employer] state no auth req EE  

## 2022-09-15 ENCOUNTER — Ambulatory Visit (INDEPENDENT_AMBULATORY_CARE_PROVIDER_SITE_OTHER): Payer: BC Managed Care – PPO

## 2022-09-15 DIAGNOSIS — J309 Allergic rhinitis, unspecified: Secondary | ICD-10-CM | POA: Diagnosis not present

## 2022-09-22 ENCOUNTER — Ambulatory Visit (INDEPENDENT_AMBULATORY_CARE_PROVIDER_SITE_OTHER): Payer: Self-pay | Admitting: Neurology

## 2022-09-22 ENCOUNTER — Ambulatory Visit (INDEPENDENT_AMBULATORY_CARE_PROVIDER_SITE_OTHER): Payer: BC Managed Care – PPO

## 2022-09-22 DIAGNOSIS — J309 Allergic rhinitis, unspecified: Secondary | ICD-10-CM | POA: Diagnosis not present

## 2022-09-22 DIAGNOSIS — Z0289 Encounter for other administrative examinations: Secondary | ICD-10-CM

## 2022-09-22 DIAGNOSIS — G40A09 Absence epileptic syndrome, not intractable, without status epilepticus: Secondary | ICD-10-CM

## 2022-09-22 DIAGNOSIS — Z5181 Encounter for therapeutic drug level monitoring: Secondary | ICD-10-CM

## 2022-09-22 DIAGNOSIS — R0683 Snoring: Secondary | ICD-10-CM

## 2022-09-29 ENCOUNTER — Ambulatory Visit (INDEPENDENT_AMBULATORY_CARE_PROVIDER_SITE_OTHER): Payer: BC Managed Care – PPO

## 2022-09-29 DIAGNOSIS — J309 Allergic rhinitis, unspecified: Secondary | ICD-10-CM | POA: Diagnosis not present

## 2022-10-20 ENCOUNTER — Ambulatory Visit (INDEPENDENT_AMBULATORY_CARE_PROVIDER_SITE_OTHER): Payer: BC Managed Care – PPO

## 2022-10-20 ENCOUNTER — Other Ambulatory Visit: Payer: BC Managed Care – PPO

## 2022-10-20 DIAGNOSIS — J309 Allergic rhinitis, unspecified: Secondary | ICD-10-CM | POA: Diagnosis not present

## 2022-10-23 ENCOUNTER — Other Ambulatory Visit: Payer: BC Managed Care – PPO

## 2022-10-29 ENCOUNTER — Encounter: Payer: Self-pay | Admitting: Neurology

## 2022-10-29 ENCOUNTER — Ambulatory Visit (INDEPENDENT_AMBULATORY_CARE_PROVIDER_SITE_OTHER): Payer: BC Managed Care – PPO

## 2022-10-29 DIAGNOSIS — J309 Allergic rhinitis, unspecified: Secondary | ICD-10-CM

## 2022-10-31 ENCOUNTER — Encounter: Payer: Self-pay | Admitting: Neurology

## 2022-11-01 ENCOUNTER — Encounter: Payer: Self-pay | Admitting: Neurology

## 2022-11-03 NOTE — Telephone Encounter (Signed)
Patient with an abnormal EEG: next step-   Yes, this would be a stay in the EMU, epilepsy monitoring unit.   I will ask my nurse to send the order to Dr Melynda Ripple.   Melvyn Novas, MD

## 2022-11-05 ENCOUNTER — Ambulatory Visit (INDEPENDENT_AMBULATORY_CARE_PROVIDER_SITE_OTHER): Payer: BC Managed Care – PPO

## 2022-11-05 DIAGNOSIS — J309 Allergic rhinitis, unspecified: Secondary | ICD-10-CM | POA: Diagnosis not present

## 2022-11-06 ENCOUNTER — Ambulatory Visit
Admission: RE | Admit: 2022-11-06 | Discharge: 2022-11-06 | Disposition: A | Discharge status: Home / Self Care | Payer: BC Managed Care – PPO | Source: Ambulatory Visit | Attending: Neurology | Admitting: Neurology

## 2022-11-06 ENCOUNTER — Encounter: Payer: Self-pay | Admitting: Neurology

## 2022-11-06 DIAGNOSIS — G40A09 Absence epileptic syndrome, not intractable, without status epilepticus: Secondary | ICD-10-CM

## 2022-11-06 DIAGNOSIS — Z5181 Encounter for therapeutic drug level monitoring: Secondary | ICD-10-CM

## 2022-11-06 DIAGNOSIS — R0683 Snoring: Secondary | ICD-10-CM

## 2022-11-06 MED ORDER — GADOPICLENOL 0.5 MMOL/ML IV SOLN
10.0000 mL | Freq: Once | INTRAVENOUS | Status: AC | PRN
  Administered 2022-11-06: 10 mL via INTRAVENOUS

## 2022-11-06 NOTE — Progress Notes (Signed)
Small lesions in the subcortical brain, these are referred to as Foci.  The appearance is not typical for demyelination/ MS.  None of the foci enhance or appear to be acute. These can be seen in migraine patients and patients with HTN.   Nothing in this MRI indicates a focus for absence seizures as seen on EEG. I like to order an EMU stay ,  Symptoms:  Amnestic spells, confusion in post ictum-  described as repetitively asking the same questions.   Most likely related to seizure activity, she has been treated for Epilepsy by Dr Anne Hahn since 06-2008, TOC in 2023. She had been very anxious , tearful, and advised me that she was claustrophobic and afraid of a new MRI.  I am very glad she allowed [place] to obtain these images .

## 2022-11-10 ENCOUNTER — Ambulatory Visit (INDEPENDENT_AMBULATORY_CARE_PROVIDER_SITE_OTHER): Payer: BC Managed Care – PPO

## 2022-11-10 DIAGNOSIS — J309 Allergic rhinitis, unspecified: Secondary | ICD-10-CM

## 2022-11-17 ENCOUNTER — Ambulatory Visit (INDEPENDENT_AMBULATORY_CARE_PROVIDER_SITE_OTHER): Payer: BC Managed Care – PPO

## 2022-11-17 ENCOUNTER — Ambulatory Visit: Payer: BC Managed Care – PPO | Admitting: Allergy & Immunology

## 2022-11-17 DIAGNOSIS — J309 Allergic rhinitis, unspecified: Secondary | ICD-10-CM

## 2022-11-24 ENCOUNTER — Ambulatory Visit (INDEPENDENT_AMBULATORY_CARE_PROVIDER_SITE_OTHER): Payer: BC Managed Care – PPO

## 2022-11-24 DIAGNOSIS — J309 Allergic rhinitis, unspecified: Secondary | ICD-10-CM | POA: Diagnosis not present

## 2022-11-29 ENCOUNTER — Encounter: Payer: Self-pay | Admitting: Neurology

## 2022-11-29 ENCOUNTER — Telehealth: Payer: Self-pay | Admitting: Neurology

## 2022-11-29 NOTE — Telephone Encounter (Signed)
Pt sent a mychart message will look at schedule and notify pt

## 2022-11-29 NOTE — Telephone Encounter (Signed)
Pt is scheduled to see Dr. Vickey Huger for 12/30/22. Called to reschedule pt due to Dr. Vickey Huger being out. 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 I informed pt that we did not have any openings before September. Is it possible to work pt in or add her onto Dr. Oliva Bustard schedule?

## 2022-11-30 NOTE — Telephone Encounter (Signed)
Dr. Vickey Huger is agreeable to working the patient in during an 8am appointment. Please call patient and work with her on finding a mutual time that works.

## 2022-11-30 NOTE — Telephone Encounter (Signed)
Replied back to pt's mychart msg informing her of appt rescheduled to 12/13/22 at 8am.

## 2022-12-01 ENCOUNTER — Ambulatory Visit: Payer: Self-pay

## 2022-12-01 DIAGNOSIS — J309 Allergic rhinitis, unspecified: Secondary | ICD-10-CM | POA: Diagnosis not present

## 2022-12-06 ENCOUNTER — Other Ambulatory Visit: Payer: Self-pay

## 2022-12-06 ENCOUNTER — Encounter (HOSPITAL_COMMUNITY): Payer: Self-pay | Admitting: Neurology

## 2022-12-06 ENCOUNTER — Inpatient Hospital Stay (HOSPITAL_COMMUNITY)
Admission: RE | Admit: 2022-12-06 | Discharge: 2022-12-09 | DRG: 101 | Disposition: A | Discharge status: Home / Self Care | Payer: BC Managed Care – PPO | Source: Ambulatory Visit | Attending: Neurology | Admitting: Neurology

## 2022-12-06 ENCOUNTER — Inpatient Hospital Stay (HOSPITAL_COMMUNITY): Payer: BC Managed Care – PPO

## 2022-12-06 DIAGNOSIS — Z87891 Personal history of nicotine dependence: Secondary | ICD-10-CM

## 2022-12-06 DIAGNOSIS — I10 Essential (primary) hypertension: Secondary | ICD-10-CM | POA: Diagnosis present

## 2022-12-06 DIAGNOSIS — Z803 Family history of malignant neoplasm of breast: Secondary | ICD-10-CM

## 2022-12-06 DIAGNOSIS — Z800 Family history of malignant neoplasm of digestive organs: Secondary | ICD-10-CM

## 2022-12-06 DIAGNOSIS — Z79899 Other long term (current) drug therapy: Secondary | ICD-10-CM | POA: Diagnosis not present

## 2022-12-06 DIAGNOSIS — Z8049 Family history of malignant neoplasm of other genital organs: Secondary | ICD-10-CM

## 2022-12-06 DIAGNOSIS — E669 Obesity, unspecified: Secondary | ICD-10-CM | POA: Diagnosis present

## 2022-12-06 DIAGNOSIS — F5104 Psychophysiologic insomnia: Secondary | ICD-10-CM | POA: Diagnosis present

## 2022-12-06 DIAGNOSIS — Z833 Family history of diabetes mellitus: Secondary | ICD-10-CM | POA: Diagnosis not present

## 2022-12-06 DIAGNOSIS — R569 Unspecified convulsions: Secondary | ICD-10-CM | POA: Diagnosis present

## 2022-12-06 DIAGNOSIS — G40109 Localization-related (focal) (partial) symptomatic epilepsy and epileptic syndromes with simple partial seizures, not intractable, without status epilepticus: Secondary | ICD-10-CM | POA: Diagnosis present

## 2022-12-06 DIAGNOSIS — G43019 Migraine without aura, intractable, without status migrainosus: Secondary | ICD-10-CM | POA: Diagnosis present

## 2022-12-06 DIAGNOSIS — G4733 Obstructive sleep apnea (adult) (pediatric): Secondary | ICD-10-CM | POA: Diagnosis present

## 2022-12-06 DIAGNOSIS — F419 Anxiety disorder, unspecified: Secondary | ICD-10-CM | POA: Diagnosis present

## 2022-12-06 DIAGNOSIS — Z6841 Body Mass Index (BMI) 40.0 and over, adult: Secondary | ICD-10-CM | POA: Diagnosis not present

## 2022-12-06 DIAGNOSIS — Z820 Family history of epilepsy and other diseases of the nervous system: Secondary | ICD-10-CM | POA: Diagnosis not present

## 2022-12-06 LAB — CBC WITH DIFFERENTIAL/PLATELET
Abs Immature Granulocytes: 0.02 10*3/uL (ref 0.00–0.07)
Basophils Absolute: 0 10*3/uL (ref 0.0–0.1)
Basophils Relative: 1 %
Eosinophils Absolute: 0.1 10*3/uL (ref 0.0–0.5)
Eosinophils Relative: 1 %
HCT: 36.9 % (ref 36.0–46.0)
Hemoglobin: 12.4 g/dL (ref 12.0–15.0)
Immature Granulocytes: 0 %
Lymphocytes Relative: 28 %
Lymphs Abs: 2.4 10*3/uL (ref 0.7–4.0)
MCH: 29.5 pg (ref 26.0–34.0)
MCHC: 33.6 g/dL (ref 30.0–36.0)
MCV: 87.9 fL (ref 80.0–100.0)
Monocytes Absolute: 0.7 10*3/uL (ref 0.1–1.0)
Monocytes Relative: 8 %
Neutro Abs: 5.4 10*3/uL (ref 1.7–7.7)
Neutrophils Relative %: 62 %
Platelets: 302 10*3/uL (ref 150–400)
RBC: 4.2 MIL/uL (ref 3.87–5.11)
RDW: 12.5 % (ref 11.5–15.5)
WBC: 8.6 10*3/uL (ref 4.0–10.5)
nRBC: 0 % (ref 0.0–0.2)

## 2022-12-06 LAB — COMPREHENSIVE METABOLIC PANEL
ALT: 37 U/L (ref 0–44)
AST: 31 U/L (ref 15–41)
Albumin: 3.6 g/dL (ref 3.5–5.0)
Alkaline Phosphatase: 53 U/L (ref 38–126)
Anion gap: 10 (ref 5–15)
BUN: 5 mg/dL — ABNORMAL LOW (ref 6–20)
CO2: 25 mmol/L (ref 22–32)
Calcium: 9.4 mg/dL (ref 8.9–10.3)
Chloride: 104 mmol/L (ref 98–111)
Creatinine, Ser: 0.75 mg/dL (ref 0.44–1.00)
GFR, Estimated: >60 mL/min (ref >60)
Glucose, Bld: 121 mg/dL — ABNORMAL HIGH (ref 70–99)
Potassium: 3.6 mmol/L (ref 3.5–5.1)
Sodium: 139 mmol/L (ref 135–145)
Total Bilirubin: 0.6 mg/dL (ref 0.3–1.2)
Total Protein: 6.4 g/dL — ABNORMAL LOW (ref 6.5–8.1)

## 2022-12-06 LAB — MAGNESIUM: Magnesium: 1.5 mg/dL — ABNORMAL LOW (ref 1.7–2.4)

## 2022-12-06 LAB — GLUCOSE, CAPILLARY: Glucose-Capillary: 84 mg/dL (ref 70–99)

## 2022-12-06 LAB — RAPID URINE DRUG SCREEN, HOSP PERFORMED
Amphetamines: NOT DETECTED
Barbiturates: NOT DETECTED
Benzodiazepines: NOT DETECTED
Cocaine: NOT DETECTED
Opiates: NOT DETECTED
Tetrahydrocannabinol: NOT DETECTED

## 2022-12-06 LAB — PROTIME-INR
INR: 1 (ref 0.8–1.2)
Prothrombin Time: 13.7 seconds (ref 11.4–15.2)

## 2022-12-06 LAB — PHOSPHORUS: Phosphorus: 3 mg/dL (ref 2.5–4.6)

## 2022-12-06 LAB — HIV ANTIBODY (ROUTINE TESTING W REFLEX): HIV Screen 4th Generation wRfx: NONREACTIVE

## 2022-12-06 MED ORDER — ALBUTEROL SULFATE (2.5 MG/3ML) 0.083% IN NEBU: 2.5000 mg | INHALATION_SOLUTION | RESPIRATORY_TRACT | Status: DC | PRN

## 2022-12-06 MED ORDER — LABETALOL HCL 5 MG/ML IV SOLN
5.0000 mg | INTRAVENOUS | Status: DC | PRN
  Administered 2022-12-06: 5 mg via INTRAVENOUS
  Filled 2022-12-06: qty 4

## 2022-12-06 MED ORDER — MAGNESIUM OXIDE -MG SUPPLEMENT 400 (240 MG) MG PO TABS
400.0000 mg | ORAL_TABLET | Freq: Once | ORAL | Status: AC
  Administered 2022-12-06: 400 mg via ORAL
  Filled 2022-12-06: qty 1

## 2022-12-06 MED ORDER — ACETAMINOPHEN 325 MG PO TABS
650.0000 mg | ORAL_TABLET | ORAL | Status: DC | PRN
  Administered 2022-12-08: 650 mg via ORAL
  Filled 2022-12-06: qty 2

## 2022-12-06 MED ORDER — MAGNESIUM SULFATE 2 GM/50ML IV SOLN: 2.0000 g | Freq: Once | INTRAVENOUS | Status: DC

## 2022-12-06 MED ORDER — AZELASTINE HCL 0.1 % NA SOLN: 2.0000 | Freq: Two times a day (BID) | NASAL | Status: DC | PRN

## 2022-12-06 MED ORDER — TRAZODONE HCL 50 MG PO TABS
150.0000 mg | ORAL_TABLET | Freq: Every day | ORAL | Status: DC
  Administered 2022-12-06 – 2022-12-08 (×3): 150 mg via ORAL
  Filled 2022-12-06 (×3): qty 1

## 2022-12-06 MED ORDER — AZELASTINE-FLUTICASONE 137-50 MCG/ACT NA SUSP: 2.0000 | Freq: Two times a day (BID) | NASAL | Status: DC | PRN

## 2022-12-06 MED ORDER — ACETAMINOPHEN 650 MG RE SUPP: 650.0000 mg | RECTAL | Status: DC | PRN

## 2022-12-06 MED ORDER — SODIUM CHLORIDE 0.9% FLUSH
3.0000 mL | Freq: Two times a day (BID) | INTRAVENOUS | Status: DC
  Administered 2022-12-06 – 2022-12-08 (×6): 3 mL via INTRAVENOUS

## 2022-12-06 MED ORDER — FOLIC ACID 1 MG PO TABS
1.0000 mg | ORAL_TABLET | Freq: Two times a day (BID) | ORAL | Status: DC
  Administered 2022-12-06 – 2022-12-09 (×4): 1 mg via ORAL
  Filled 2022-12-06 (×6): qty 1

## 2022-12-06 MED ORDER — CETIRIZINE HCL 10 MG PO TABS
10.0000 mg | ORAL_TABLET | Freq: Every day | ORAL | Status: DC | PRN
  Administered 2022-12-07: 10 mg via ORAL
  Filled 2022-12-06 (×2): qty 1

## 2022-12-06 MED ORDER — ENOXAPARIN SODIUM 40 MG/0.4ML IJ SOSY
40.0000 mg | PREFILLED_SYRINGE | INTRAMUSCULAR | Status: DC
  Administered 2022-12-06 – 2022-12-08 (×3): 40 mg via SUBCUTANEOUS
  Filled 2022-12-06 (×3): qty 0.4

## 2022-12-06 MED ORDER — FLUTICASONE PROPIONATE 50 MCG/ACT NA SUSP: 2.0000 | Freq: Two times a day (BID) | NASAL | Status: DC | PRN

## 2022-12-06 MED ORDER — MIDAZOLAM HCL 2 MG/2ML IJ SOLN: 2.0000 mg | INTRAMUSCULAR | Status: DC | PRN

## 2022-12-06 MED ORDER — LABETALOL HCL 200 MG PO TABS
200.0000 mg | ORAL_TABLET | Freq: Two times a day (BID) | ORAL | Status: DC
  Administered 2022-12-06 – 2022-12-09 (×6): 200 mg via ORAL
  Filled 2022-12-06 (×7): qty 1

## 2022-12-06 NOTE — Progress Notes (Signed)
EMU hooked up and running. [facility] was able to monitor. Test button was tested.

## 2022-12-06 NOTE — H&P (Signed)
CC: SEIZURE  History is obtained from: patient, chart review  HPI: Taylor Collier is a 47 y.o. female with past medical history of hypertension, migraines, epilepsy was admitted to epilepsy monitoring unit for characterization of spells.  Patient states states she had her first seizure in February 2010.  Described as generalized tonic-clonic seizure.  She was not started on any antiseizure medications.  Subsequently in 2011 she had 2 generalized tonic-clonic seizures and was started on lamotrigine.  She had another generalized tonic-clonic seizure in 2023 and Keppra was added.  States no further generalized tonic-clonic seizure since then.  However in the last 6 months to 1 year, patient's husband has noticed that she appears to be staring off at times, is able to answer questions but does not remember the interaction later.  Also asks questions again and again sometimes, without any memory of having the conversation before.  States these episodes last for just a couple of minutes.  States however she feels confused for multiple hours after that.  Denies any clear warning signs.  Denies any shaking, twitching.  Epilepsy risk factors: Marginal delivery, born 1 month earlier and had to stay in the ICU, denies any meningitis/encephalitis, denies febrile seizures, denies family history of epilepsy, denies brain injury with loss of consciousness  Current AEDs: Lamotrigine 200 mg twice daily Keppra 750 mg twice daily  No other AEDs in the past  EEG 01/05/2022: abnormal EEG recording in the waking and sleeping state due to intermittent right temporoparietal focal slowing. 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    ROS: All other systems reviewed and negative except as noted in the HPI.   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 Her smoking use included cigarettes. She started smoking about 28 years ago. She has a 0.8 pack-year smoking history. She has never used smokeless tobacco. She reports current drug use. Drug: Psilocybin. She reports that she does not drink alcohol.  Medications Prior to Admission  Medication Sig Dispense Refill Last Dose   acetaminophen (TYLENOL) 500 MG tablet Take 1,000 mg by mouth daily.      albuterol (VENTOLIN HFA) 108 (90 Base) MCG/ACT inhaler Inhale 2 puffs into the lungs every 4 (four) hours as needed.      ALPRAZolam (XANAX) 1 MG tablet Take 1 tablet (1 mg total) by mouth at bedtime as needed for anxiety. 2 tablet 0    Azelastine-Fluticasone (DYMISTA) 137-50 MCG/ACT SUSP Place 2 sprays into the nose in the morning and at bedtime. INSTILL TWO SPRAYS INTO BOTH NOSTRILS DAILY 23 g 5    Biotin 5000 MCG TABS Take 1 tablet by mouth daily.      CALCIUM PO Take 600 mg by mouth daily.  chlorthalidone (HYGROTON) 25 MG tablet TAKE ONE TABLET BY MOUTH EVERY DAY - PT. NEEDS OFFICE VISIT 7 tablet 0    EPINEPHrine 0.3 mg/0.3 mL IJ SOAJ injection Inject one dose intramuscularly for allergic reaction. May repeat one dose if needed after 5-15 minutes. Proceed to the ER (Patient not taking: Reported on 05/19/2022) 1 each 1    FLOWFLEX COVID-19 AG HOME TEST KIT See admin instructions.      folic acid (FOLVITE) 1 MG tablet TAKE 1 TABLET BY MOUTH TWICE DAILY 60 tablet 12    labetalol (NORMODYNE) 200 MG tablet  TAKE 1 TABLET BY MOUTH TWICE DAILY 4 tablet 0    lamoTRIgine (LAMICTAL) 200 MG tablet Take 1 tablet (200 mg total) by mouth 2 (two) times daily. 180 tablet 3    levETIRAcetam (KEPPRA) 750 MG tablet Take 1 tablet (750 mg total) by mouth 2 (two) times daily. 60 tablet 6    levocetirizine (XYZAL) 5 MG tablet TAKE 1 TABLET BY MOUTH DAILY AS NEEDED FOR runny nose 30 tablet 5    Multiple Vitamin (MULTIVITAMIN) tablet Take 1 tablet by mouth daily.      traZODone (DESYREL) 150 MG tablet Take 1 tablet (150 mg total) by mouth at bedtime. 90 tablet 3    UNABLE TO FIND Allergy Shot 1 per week         Exam: Current vital signs: BP (!) 145/92 (BP Location: Right Arm)   Pulse 77   Temp 98.1 F (36.7 C) (Oral)   Resp 13   SpO2 96%  Vital signs in last 24 hours: Temp:  [98.1 F (36.7 C)] 98.1 F (36.7 C) (07/15 1001) Pulse Rate:  [77] 77 (07/15 1001) Resp:  [13] 13 (07/15 1001) BP: (145)/(92) 145/92 (07/15 1001) SpO2:  [96 %] 96 % (07/15 1001)   Physical Exam  Constitutional: Appears well-developed and well-nourished.  Psych: Affect appropriate to situation Neuro: AOx3, no aphasia, cranial nerves II to XII grossly intact, 5/5 in all extremities   I have reviewed labs in epic and the results pertinent to this consultation are: CBC: No results for input(s): "WBC", "NEUTROABS", "HGB", "HCT", "MCV", "PLT" in the last 168 hours.  Basic Metabolic Panel:  Lab Results  Component Value Date   NA 137 12/25/2020   K 3.9 12/25/2020   CO2 22 12/25/2020   GLUCOSE 87 12/25/2020   BUN 14 12/25/2020   CREATININE 0.74 12/25/2020   CALCIUM 10.3 (H) 12/25/2020   GFRNONAA 96 12/26/2019   GFRAA 111 12/26/2019   Lipid Panel: No results found for: "LDLCALC" HgbA1c: No results found for: "HGBA1C" Urine Drug Screen:     Component Value Date/Time   LABOPIA NONE DETECTED 12/18/2009 2159   COCAINSCRNUR NONE DETECTED 12/18/2009 2159   LABBENZ NONE DETECTED 12/18/2009 2159   AMPHETMU NONE DETECTED  12/18/2009 2159   THCU NONE DETECTED 12/18/2009 2159   LABBARB  12/18/2009 2159    NONE DETECTED        DRUG SCREEN FOR MEDICAL PURPOSES ONLY.  IF CONFIRMATION IS NEEDED FOR ANY PURPOSE, NOTIFY LAB WITHIN 5 DAYS.        LOWEST DETECTABLE LIMITS FOR URINE DRUG SCREEN Drug Class       Cutoff (ng/mL) Amphetamine      1000 Barbiturate      200 Benzodiazepine   200 Tricyclics       300 Opiates          300 Cocaine          300  THC              50    Alcohol Level     Component Value Date/Time   [facility]  12/18/2009 2021    <5        LOWEST DETECTABLE LIMIT FOR SERUM ALCOHOL IS 5 mg/dL FOR MEDICAL PURPOSES ONLY     I have reviewed the images obtained:  MRI Brain w and wo contrast 11/06/2022: Scattered T2/FLAIR hyperintense foci in the subcortical and deep white matter of the cerebral hemispheres.  There are nonspecific, these most likely represent sequela of moderate for age chronic microvascular ischemic change or migraine.  The appearance is not typical for demyelination. None of the foci enhance or appear to be acute.Left mastoid effusion. No acute findings. Normal enhancement pattern.   ASSESSMENT/PLAN: 47 year old female admitted to epilepsy monitoring unit for characterization of spells.  Seizure Transient alteration of awareness -Start video EEG monitoring for characterization of spells Hold Keppra and lamotrigine tonight -Continue seizure precautions -As needed IV Versed for clinical seizures  Hypertension -Continue home meds  Allergies -Continue home meds  Insomnia -Continue home meds  I have spent a total of   78 minutes with the patient reviewing hospital notes,  test results, labs and examining the patient as well as establishing an assessment and plan that was discussed personally with the patient.  > 50% of time was spent in direct patient care.       Lindie Spruce Epilepsy [facility]

## 2022-12-06 NOTE — TOC CM/SW Note (Signed)
Transition of Care [facility]) - Inpatient Brief Assessment   Patient Details  Name: Taylor Collier MRN: [id number] Date of Birth: January 07, 1976  [facility] CM/SW Contact:    Kermit Balo, RN Phone Number: 12/06/2022, 1:11 PM   Clinical Narrative: Pt admitted for EMU.   Transition of Care Asessment: Insurance and Status: Insurance coverage has been reviewed Patient has primary care physician: Yes Home environment has been reviewed: home with spouse   Prior/Current Home Services: No current home services Social Determinants of Health Reivew: SDOH reviewed no interventions necessary Readmission risk has been reviewed: Yes Transition of care needs: no transition of care needs at this time

## 2022-12-07 ENCOUNTER — Other Ambulatory Visit (HOSPITAL_COMMUNITY): Payer: Self-pay

## 2022-12-07 DIAGNOSIS — R569 Unspecified convulsions: Secondary | ICD-10-CM | POA: Diagnosis not present

## 2022-12-07 MED ORDER — LAMOTRIGINE 100 MG PO TABS
200.0000 mg | ORAL_TABLET | Freq: Two times a day (BID) | ORAL | Status: DC
  Administered 2022-12-07 – 2022-12-09 (×5): 200 mg via ORAL
  Filled 2022-12-07 (×5): qty 2

## 2022-12-07 MED ORDER — CHLORTHALIDONE 25 MG PO TABS
25.0000 mg | ORAL_TABLET | Freq: Every day | ORAL | Status: DC
  Administered 2022-12-07 – 2022-12-09 (×3): 25 mg via ORAL
  Filled 2022-12-07 (×3): qty 1

## 2022-12-07 MED ORDER — ROSUVASTATIN CALCIUM 5 MG PO TABS
10.0000 mg | ORAL_TABLET | Freq: Every day | ORAL | Status: DC
  Administered 2022-12-07 – 2022-12-09 (×3): 10 mg via ORAL
  Filled 2022-12-07 (×3): qty 2

## 2022-12-07 MED ORDER — LEVETIRACETAM 500 MG PO TABS
1000.0000 mg | ORAL_TABLET | Freq: Two times a day (BID) | ORAL | Status: DC
  Administered 2022-12-07 (×2): 1000 mg via ORAL
  Filled 2022-12-07 (×2): qty 2

## 2022-12-07 NOTE — Procedures (Signed)
Patient Name: Taylor Collier  MRN: [id number]  Epilepsy Attending: Charlsie Quest  Referring Physician/Provider: Charlsie Quest, MD  Duration: 12/06/2022 1111 to 12/07/2022 1111  Patient history: 47 year old female admitted to epilepsy monitoring unit for characterization of spells. EEG to evaluate for seizure.   Level of alertness: Awake, asleep  AEDs during EEG study: None  Technical aspects: This EEG study was done with scalp electrodes positioned according to the 10-20 International system of electrode placement. Electrical activity was reviewed with band pass filter of 1-70Hz , sensitivity of 7 uV/mm, display speed of 46mm/sec with a 60Hz  notched filter applied as appropriate. EEG data were recorded continuously and digitally stored.  Video monitoring was available and reviewed as appropriate.  Description: The posterior dominant rhythm consists of 10-11 Hz activity of moderate voltage (25-35 uV) seen predominantly in posterior head regions, symmetric and reactive to eye opening and eye closing. Sleep was characterized by vertex waves, sleep spindles (12 to 14 Hz), maximal frontocentral region.  Independent sharp waves were noted in left> right frontal-anterior temporal region.   Seizure was noted arising from left frontal-anterior temporal region.  At the onset of seizure, patient was sleeping (snoring).  Patient appeared to move in bed, scratching her nose and briefly coughing at the end of the seizure. No other clinical signs were noted.  Concomitant EEG showed 3 Hz rhythmic delta slowing in left frontal-anterior temporal region.  EEG gradually evolved into 4 to 5 Hz sharply contoured theta slowing and involved all of left hemisphere.  Subsequently EEG showed 3 to 5 Hz theta with delta slowing admixed with sharp waves, maximal left frontal- anterior temporal region. Onset of seizure was on 12/07/2022 at 0447, total duration 1 minute 50 seconds.  Photic driving was not seen during photic  stimulation. No EEG change was seen during hyperventilation.  ABNORMALITY -Focal seizure,  left frontal-anterior temporal region -Sharp wave,  left >right frontal-anterior temporal region  IMPRESSION: This study showed 1 seizure on 12/07/2022 at 0447 arising from  left frontal-anterior temporal region.  During the seizure no clinical signs were noted.  Seizure lasted for about 1 minute 50 seconds.  Additionally EEG showed independent epileptogenicity arising from  left > right frontal-anterior temporal region.                 [name] Annabelle Harman

## 2022-12-07 NOTE — Progress Notes (Signed)
EMU LTM maint complete - no skin breakdown Serviced Fp1 Cz O2 Monitored by [facility]

## 2022-12-07 NOTE — Progress Notes (Signed)
LTM maint complete - no skin breakdown under Fp2 F4 [facility] monitored

## 2022-12-07 NOTE — TOC Benefit Eligibility Note (Signed)
Pharmacy Patient Advocate Encounter  Insurance verification completed.    The patient is insured through [employer] [facility]   Ran test claim for Valtoco 20 mg and the current 30 day co-pay is $47.00.   This test claim was processed through [facility]- copay amounts may vary at other pharmacies due to pharmacy/plan contracts, or as the patient moves through the different stages of their insurance plan.    Roland Earl, CPHT Pharmacy Patient Advocate Specialist [facility] Patient Advocate Team Direct Number: [phone]  Fax: [phone]

## 2022-12-07 NOTE — Progress Notes (Signed)
Subjective: Did not report any seizure-like activity overnight.  However did have 1 seizure overnight that patient was unaware of.  No other concerns.  ROS: negative except above  Examination  Vital signs in last 24 hours: Temp:  [97.6 F (36.4 C)-98.5 F (36.9 C)] 97.7 F (36.5 C) (07/16 0944) Pulse Rate:  [66-75] 72 (07/16 1240) Resp:  [17-20] 20 (07/16 1240) BP: (128-173)/(71-87) 132/87 (07/16 1240) SpO2:  [94 %-97 %] 96 % (07/16 1240)  General: lying in bed, NAD Neuro: AOx3, no aphasia, cranial nerves II to XII grossly intact, 5/5 in all extremities   Basic Metabolic Panel: Recent Labs  Lab 12/06/22 1110  NA 139  K 3.6  CL 104  CO2 25  GLUCOSE 121*  BUN 5*  CREATININE 0.75  CALCIUM 9.4  MG 1.5*  PHOS 3.0    CBC: Recent Labs  Lab 12/06/22 1110  WBC 8.6  NEUTROABS 5.4  HGB 12.4  HCT 36.9  MCV 87.9  PLT 302     Coagulation Studies: Recent Labs    12/06/22 1110  LABPROT 13.7  INR 1.0    Imaging No new brain imaging overnight  ASSESSMENT AND PLAN: 47 year old female admitted to epilepsy monitoring unit for characterization of spells.   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 -Continue video EEG monitoring for characterization of spells -Resume home dose of lamotrigine 200 mg twice daily -Resume Keppra at increased dose of 1000 mg twice daily (home dose was 750 mg twice daily) -Discussed overnight EEG findings  -Continue seizure precautions -As needed IV Versed for clinical seizures   Hypertension -Continue home meds   Allergies -Continue home meds   Insomnia -Continue home meds    I have spent a total of   36 minutes with the patient reviewing hospital notes,  test results, labs and examining the patient as well as establishing an assessment and plan that was discussed personally with the patient.  > 50% of time was spent in direct patient care.      Lindie Spruce Epilepsy [facility] For questions after 5pm please  refer to AMION to reach the Neurologist on call

## 2022-12-07 NOTE — Plan of Care (Signed)
  Problem: Education: Goal: Knowledge of General Education information will improve Description Including pain rating scale, medication(s)/side effects and non-pharmacologic comfort measures Outcome: Progressing   Problem: Health Behavior/Discharge Planning: Goal: Ability to manage health-related needs will improve Outcome: Progressing   

## 2022-12-08 DIAGNOSIS — R569 Unspecified convulsions: Secondary | ICD-10-CM | POA: Diagnosis not present

## 2022-12-08 MED ORDER — LEVETIRACETAM 750 MG PO TABS
1250.0000 mg | ORAL_TABLET | Freq: Two times a day (BID) | ORAL | Status: DC
  Administered 2022-12-08 – 2022-12-09 (×3): 1250 mg via ORAL
  Filled 2022-12-08 (×3): qty 1

## 2022-12-08 NOTE — Progress Notes (Addendum)
Subjective: Had another seizure yesterday lasting about 5 minutes.  Patient does not remember the episode.  Does remember talking to her friend.  Was tearful after hearing about it because this will impact her driving privileges and consequently her ability to work.  ROS: negative except above  Examination  Vital signs in last 24 hours: Temp:  [97.1 F (36.2 C)-98.9 F (37.2 C)] 98.6 F (37 C) (07/17 1227) Pulse Rate:  [67-79] 74 (07/17 1227) Resp:  [18-21] 18 (07/17 0342) BP: (109-159)/(60-110) 125/73 (07/17 1227) SpO2:  [94 %-100 %] 96 % (07/17 1227)  General: lying in bed, NAD Neuro: AOx3, no aphasia, cranial nerves II to XII grossly intact, 5/5 in all extremities   Basic Metabolic Panel: Recent Labs  Lab 12/06/22 1110  NA 139  K 3.6  CL 104  CO2 25  GLUCOSE 121*  BUN 5*  CREATININE 0.75  CALCIUM 9.4  MG 1.5*  PHOS 3.0    CBC: Recent Labs  Lab 12/06/22 1110  WBC 8.6  NEUTROABS 5.4  HGB 12.4  HCT 36.9  MCV 87.9  PLT 302     Coagulation Studies: Recent Labs    12/06/22 1110  LABPROT 13.7  INR 1.0    Imaging No new brain imaging overnight   ASSESSMENT AND PLAN: 47 year old female admitted to epilepsy monitoring unit for characterization of spells.   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 -Continue video EEG monitoring for characterization of spells -Resume home dose of lamotrigine 200 mg twice daily -Increased Keppra to  1250 mg twice daily (home dose was 750 mg twice daily) -Discussed overnight EEG findings  -Continue seizure precautions -As needed IV Versed for clinical seizures   Hypertension -Continue home meds   Allergies -Continue home meds   Insomnia -Continue home meds  Obesity -BMI 49 -Counseled about healthy diet     I have spent a total of   39 minutes with the patient reviewing hospital notes,  test results, labs and examining the patient as well as establishing an assessment and plan that was discussed personally with  the patient.  > 50% of time was spent in direct patient care.        Lindie Spruce Epilepsy [facility] For questions after 5pm please refer to AMION to reach the Neurologist on call us

## 2022-12-08 NOTE — Progress Notes (Signed)
vLTM maintinance  No skin breakdown noted at A1 F3  F8 02  All impedances below 10k

## 2022-12-08 NOTE — Procedures (Addendum)
Patient Name: Taylor Collier  MRN: [id number]  Epilepsy Attending: Charlsie Quest  Referring Physician/Provider: Charlsie Quest, MD  Duration: 12/07/2022 1111 to 12/08/2022 1111   Patient history: 47 year old female admitted to epilepsy monitoring unit for characterization of spells. EEG to evaluate for seizure.    Level of alertness: Awake, asleep   AEDs during EEG study: LEV, ZNS   Technical aspects: This EEG study was done with scalp electrodes positioned according to the 10-20 International system of electrode placement. Electrical activity was reviewed with band pass filter of 1-70Hz , sensitivity of 7 uV/mm, display speed of 5mm/sec with a 60Hz  notched filter applied as appropriate. EEG data were recorded continuously and digitally stored.  Video monitoring was available and reviewed as appropriate.   Description: The posterior dominant rhythm consists of 10-11 Hz activity of moderate voltage (25-35 uV) seen predominantly in posterior head regions, symmetric and reactive to eye opening and eye closing. Sleep was characterized by vertex waves, sleep spindles (12 to 14 Hz), maximal frontocentral region. Independent sharp waves were noted in left> right frontal-anterior temporal region.   Seizure was noted arising from right posterior temporal region.  During the seizure, patient was sitting up in bed talking to a visitor in the room. Throughout the seizure, patient continued to participate in the conversation with the visitor. She was also noted to be scrolling on her phone with her right hand.  At times she was noted to have what looked like automatisms with the left hand briefly and at 1 point was also noted to be looking to the left side (however the window was also on the left side so unclear if she was just looking outside the window).  At the onset, EEG showed 5 to 6 Hz theta slowing in right posterior temporal region which then became more sharply contoured and involved all of right  hemisphere as well as left posterior temporal region.  Subsequently EEG evolved into 2 to 3 Hz delta slowing admixed with spikes in right hemisphere, maximal right posterior temporal region. Onset of seizure was on 12/07/2022 at 1352, total duration was about 4 minutes.   ABNORMALITY -Focal seizure, right posterior temporal region -Sharp wave,  left >right frontal-anterior temporal region   IMPRESSION: This study showed 1 seizure on 12/07/2022 at 1352 arising from right posterior temporal region as described above. Seizure lasted for about 4 minutes.  Additionally EEG showed independent epileptogenicity arising from  left > right frontal-anterior temporal region.         Taylor Collier

## 2022-12-08 NOTE — Progress Notes (Signed)
LTM maint complete - no skin breakdown under: O2, A2, F7

## 2022-12-09 ENCOUNTER — Other Ambulatory Visit (HOSPITAL_COMMUNITY): Payer: Self-pay

## 2022-12-09 DIAGNOSIS — G40109 Localization-related (focal) (partial) symptomatic epilepsy and epileptic syndromes with simple partial seizures, not intractable, without status epilepticus: Secondary | ICD-10-CM | POA: Diagnosis not present

## 2022-12-09 DIAGNOSIS — R569 Unspecified convulsions: Secondary | ICD-10-CM | POA: Diagnosis not present

## 2022-12-09 MED ORDER — VALTOCO 20 MG DOSE 10 MG/0.1ML NA LQPK
20.0000 mg | NASAL | 0 refills | Status: DC | PRN
  Filled 2022-12-09: qty 4, 7d supply, fill #0

## 2022-12-09 MED ORDER — LEVETIRACETAM 250 MG PO TABS
1250.0000 mg | ORAL_TABLET | Freq: Two times a day (BID) | ORAL | 2 refills | Status: DC
Start: 2022-12-09 — End: 2022-12-13
  Filled 2022-12-09: qty 47, 5d supply, fill #0
  Filled 2022-12-09: qty 240, 24d supply, fill #0

## 2022-12-09 NOTE — Discharge Summary (Addendum)
Physician Discharge Summary  Patient ID: Taylor Collier MRN: [id number] DOB/AGE: Apr 06, 1976 47 y.o.  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  During this time, she underwent continuous video EEG monitoring.  Antiseizure medications were held.  HV and photic stimulation were performed.  Her EEG showed evidence of epilepsy arising from left and right frontal-anterior temporal region.  One seizure was noted on 12/07/2022 at 0447 arising from  left frontal-anterior temporal region.  During the seizure no clinical signs were noted.  Seizure lasted for about 1 minute 50 seconds. One seizure on 12/07/2022 at 1352 arising from right posterior temporal region during which patient was talking to her friend, no clinical signs were noted.  Seizure lasted for about 4 minutes.  Home lamotrigine was resumed and Keppra was increased to 1250 mg twice daily.  Intranasal Valtoco was also prescribed for rescue.  Seizure precaution including do not drive or discussed.  She will follow-up with Dr. Vickey Huger on 12/13/2022 at 8 AM.  Can consider ambulatory EEG in a few weeks to make sure patient remains seizure-free because both her seizures were subclinical.   Seizure precautions including do not drive have been discussed  Consults: None  Significant Diagnostic Studies: EEG  The posterior dominant rhythm consists of 10-11 Hz activity of moderate voltage (25-35 uV) seen predominantly in posterior head regions, symmetric and reactive to eye opening and eye closing. Sleep was characterized by vertex waves, sleep spindles (12 to 14 Hz), maximal frontocentral region. Independent sharp waves were noted in left> right frontal-anterior temporal region.   Seizure was noted arising from left  frontal-anterior temporal region.  At the onset of seizure, patient was sleeping (snoring).  Patient appeared to move in bed, scratching her nose and briefly coughing at the end of the seizure. No other clinical signs were noted.  Concomitant EEG showed 3 Hz rhythmic delta slowing in left frontal-anterior temporal region.  EEG gradually evolved into 4 to 5 Hz sharply contoured theta slowing and involved all of left hemisphere.  Subsequently EEG showed 3 to 5 Hz theta with delta slowing admixed with sharp waves, maximal left frontal- anterior temporal region. Onset of seizure was on 12/07/2022 at 0447, total duration 1 minute 50 seconds.    Seizure was noted arising from right posterior temporal region.  During the seizure, patient was sitting up in bed talking to a visitor in the room. Throughout the seizure, patient continued to participate in the conversation with the visitor. She was also noted to be scrolling on her phone with her right hand.  At times she was noted to have what looked like automatisms with the left hand briefly and at 1 point was also noted to be looking to the left side (however the window was also on the left side so unclear if she was just looking outside the window).  At the onset, EEG showed 5 to 6 Hz theta slowing in right posterior temporal region which then became more sharply contoured and involved all of right hemisphere as well as left posterior temporal region.  Subsequently EEG evolved into 2 to 3 Hz delta slowing admixed with spikes in right hemisphere, maximal right posterior temporal region. Onset of seizure was on 12/07/2022 at 1352, total duration was about 4 minutes.  Photic driving was not seen during photic stimulation. No EEG  change was seen during hyperventilation.    ABNORMALITY - Focal seizure, left frontal-anterior temporal region  -Focal seizure, right posterior temporal region -Sharp wave,  left >right frontal-anterior temporal region   IMPRESSION: This  study showed 1 seizure on 12/07/2022 at 0447 arising from  left frontal-anterior temporal region.  During the seizure no clinical signs were noted.  Seizure lasted for about 1 minute 50 seconds. Second seizure was noted on 12/07/2022 at 1352 arising from right posterior temporal region as described above. Seizure lasted for about 4 minutes. Additionally EEG showed independent epileptogenicity arising from  left > right frontal-anterior temporal region.      Treatments: Lamotrigine 200 mg twice daily, increase Keppra to 1250 mg twice daily  Discharge Exam: Blood pressure 137/84, pulse 68, temperature 98.4 F (36.9 C), temperature source Oral, resp. rate 14, height 5\' 4"  (1.626 m), weight 129.5 kg, SpO2 100%. General: lying in bed, NAD Neuro: AOx3, no aphasia, cranial nerves II to XII grossly intact, 5/5 in all extremities   Discharge disposition: 01-Home or Self Care   Discharge Instructions     Call MD for:   Complete by: As directed    If patient has another seizure, call 911 and bring them back to the ED if: A.  The seizure lasts longer than 5 minutes.      B.  The patient doesn't wake shortly after the seizure or has new problems such as difficulty seeing, speaking or moving following the seizure C.  The patient was injured during the seizure D.  The patient has a temperature over 102 F (39C) E.  The patient vomited during the seizure and now is having trouble breathing      Diet - low sodium heart healthy   Complete by: As directed    Discharge instructions   Complete by: As directed    Use caution when using heavy equipment or power tools. Avoid working on ladders or at heights. Take showers instead of baths. Ensure the water temperature is not too high on the home water heater. Do not go swimming alone. Do not lock yourself in a room alone (i.e. bathroom). When caring for infants or small children, sit down when holding, feeding, or changing them to minimize risk of injury to the  child in the event you have a seizure. Maintain good sleep hygiene. Avoid alcohol.      Driving Restrictions   Complete by: As directed    Per [facility] statutes, patients with seizures are not allowed to drive until they have been seizure-free for six months and cleared by a physician      Increase activity slowly   Complete by: As directed       Allergies as of 12/09/2022       Reactions   Amoxicillin-pot Clavulanate Rash   Penicillins Rash        Medication List     TAKE these medications    acetaminophen 500 MG tablet Commonly known as: TYLENOL Take 1,000 mg by mouth daily.   albuterol 108 (90 Base) MCG/ACT inhaler Commonly known as: VENTOLIN HFA Inhale 2 puffs into the lungs as needed for wheezing or shortness of breath.   Azelastine-Fluticasone 137-50 MCG/ACT Susp Commonly known as: Dymista Place 2 sprays into the nose in the morning and at bedtime. INSTILL TWO SPRAYS INTO BOTH NOSTRILS DAILY What changed:  when to take this reasons to take this additional instructions   Biotin 1000 MCG tablet Take 1,000 mcg by mouth  daily.   CALCIUM-VITAMIN D PO Take 1 tablet by mouth daily.   chlorthalidone 25 MG tablet Commonly known as: HYGROTON TAKE ONE TABLET BY MOUTH EVERY DAY - PT. NEEDS OFFICE VISIT What changed: See the new instructions.   EPINEPHrine 0.3 mg/0.3 mL Soaj injection Commonly known as: EPI-PEN Inject one dose intramuscularly for allergic reaction. May repeat one dose if needed after 5-15 minutes. Proceed to the ER   labetalol 200 MG tablet Commonly known as: NORMODYNE TAKE 1 TABLET BY MOUTH TWICE DAILY   lamoTRIgine 200 MG tablet Commonly known as: LAMICTAL Take 1 tablet (200 mg total) by mouth 2 (two) times daily.   levETIRAcetam 250 MG tablet Commonly known as: KEPPRA Take 5 tablets (1,250 mg total) by mouth 2 (two) times daily. What changed:  medication strength how much to take   levocetirizine 5 MG tablet Commonly known  as: XYZAL TAKE 1 TABLET BY MOUTH DAILY AS NEEDED FOR runny nose What changed: See the new instructions.   multivitamin tablet Take 1 tablet by mouth daily.   OVER THE COUNTER MEDICATION Take 900 mg by mouth daily. Echinacea goldenseal 900 mg   rosuvastatin 10 MG tablet Commonly known as: CRESTOR Take 10 mg by mouth daily.   traZODone 150 MG tablet Commonly known as: DESYREL Take 1 tablet (150 mg total) by mouth at bedtime.   UNABLE TO FIND Allergy Shot 1 per week   Valtoco 20 MG Dose 10 MG/0.1ML Lqpk Generic drug: diazePAM (20 MG Dose) Place 20 mg into the nose as needed for up to 5 doses (seizure lasting more than 2 minutees).   vitamin C with rose hips 500 MG tablet Take 500 mg by mouth daily.       Seizure precautions: Per [facility] statutes, patients with seizures are not allowed to drive until they have been seizure-free for six months and cleared by a physician    Use caution when using heavy equipment or power tools. Avoid working on ladders or at heights. Take showers instead of baths. Ensure the water temperature is not too high on the home water heater. Do not go swimming alone. Do not lock yourself in a room alone (i.e. bathroom). When caring for infants or small children, sit down when holding, feeding, or changing them to minimize risk of injury to the child in the event you have a seizure. Maintain good sleep hygiene. Avoid alcohol.    If patient has another seizure, call 911 and bring them back to the ED if: A.  The seizure lasts longer than 5 minutes.      B.  The patient doesn't wake shortly after the seizure or has new problems such as difficulty seeing, speaking or moving following the seizure C.  The patient was injured during the seizure D.  The patient has a temperature over 102 F (39C) E.  The patient vomited during the seizure and now is having trouble breathing    During the Seizure   - First, ensure adequate ventilation and place  patients on the floor on their left side  Loosen clothing around the neck and ensure the airway is patent. If the patient is clenching the teeth, do not force the mouth open with any object as this can cause severe damage - Remove all items from the surrounding that can be hazardous. The patient may be oblivious to what's happening and may not even know what he or she is doing. If the patient is confused and wandering, either  gently guide him/her away and block access to outside areas - Reassure the individual and be comforting - Call 911. In most cases, the seizure ends before EMS arrives. However, there are cases when seizures may last over 3 to 5 minutes. Or the individual may have developed breathing difficulties or severe injuries. If a pregnant patient or a person with diabetes develops a seizure, it is prudent to call an ambulance. - Finally, if the patient does not regain full consciousness, then call EMS. Most patients will remain confused for about 45 to 90 minutes after a seizure, so you must use judgment in calling for help.     After the Seizure (Postictal Stage)   After a seizure, most patients experience confusion, fatigue, muscle pain and/or a headache. Thus, one should permit the individual to sleep. For the next few days, reassurance is essential. Being calm and helping reorient the person is also of importance.   Most seizures are painless and end spontaneously. Seizures are not harmful to others but can lead to complications such as stress on the lungs, brain and the heart. Individuals with prior lung problems may develop labored breathing and respiratory distress.     I have spent a total of   36 minutes with the patient reviewing hospital notes,  test results, labs and examining the patient as well as establishing an assessment and plan that was discussed personally with the patient.  > 50% of time was spent in direct patient care.   Signed: Charlsie Quest 12/09/2022, 7:52  AM

## 2022-12-09 NOTE — TOC Transition Note (Signed)
Transition of Care [facility]) - CM/SW Discharge Note   Patient Details  Name: Taylor Collier MRN: [id number] Date of Birth: [birth year]  [facility] CM/SW Contact:  Kermit Balo, RN Phone Number: 12/09/2022, 8:36 AM   Clinical Narrative:    Pt is discharging home with self care. No needs per TOC.   Final next level of care: Home/Self Care Barriers to Discharge: No Barriers Identified   Patient Goals and CMS Choice      Discharge Placement                         Discharge Plan and Services Additional resources added to the After Visit Summary for                                       Social Determinants of Health (SDOH) Interventions SDOH Screenings   Food Insecurity: No Food Insecurity (12/06/2022)  Housing: Patient Declined (12/06/2022)  Transportation Needs: No Transportation Needs (12/06/2022)  Utilities: Not At Risk (12/06/2022)  Physical Activity: Insufficiently Active (12/01/2021)   Received from [facility], [facility]  Stress: No Stress Concern Present (12/01/2021)   Received from [facility], [facility]  Tobacco Use: Medium Risk (12/06/2022)  Health Literacy: Low Risk  (08/04/2020)   Received from [facility], [facility]     Readmission Risk Interventions     No data to display

## 2022-12-09 NOTE — Progress Notes (Signed)
Nursing Discharge Note  Name: Taylor Collier MRN: [id number]  Admit Date: 12/06/2022  Discharge date: 12/09/2022   Taylor Collier is to be discharged home per MD order.  AVS completed. Reviewed with patient and family at bedside. Highlighted copy provided for patient to take home.  Patient/caregiver able to verbalize understanding of discharge instructions. PIV removed. Patient stable upon discharge.   Discharge Instructions     Call MD for:   Complete by: As directed    If patient has another seizure, call 911 and bring them back to the ED if: A.  The seizure lasts longer than 5 minutes.      B.  The patient doesn't wake shortly after the seizure or has new problems such as difficulty seeing, speaking or moving following the seizure C.  The patient was injured during the seizure D.  The patient has a temperature over 102 F (39C) E.  The patient vomited during the seizure and now is having trouble breathing      Diet - low sodium heart healthy   Complete by: As directed    Discharge instructions   Complete by: As directed    Use caution when using heavy equipment or power tools. Avoid working on ladders or at heights. Take showers instead of baths. Ensure the water temperature is not too high on the home water heater. Do not go swimming alone. Do not lock yourself in a room alone (i.e. bathroom). When caring for infants or small children, sit down when holding, feeding, or changing them to minimize risk of injury to the child in the event you have a seizure. Maintain good sleep hygiene. Avoid alcohol.      Driving Restrictions   Complete by: As directed    Per [facility] statutes, patients with seizures are not allowed to drive until they have been seizure-free for six months and cleared by a physician      Increase activity slowly   Complete by: As directed       Allergies as of 12/09/2022       Reactions   Amoxicillin-pot Clavulanate Rash   Penicillins Rash         Medication List     TAKE these medications    acetaminophen 500 MG tablet Commonly known as: TYLENOL Take 1,000 mg by mouth daily.   albuterol 108 (90 Base) MCG/ACT inhaler Commonly known as: VENTOLIN HFA Inhale 2 puffs into the lungs as needed for wheezing or shortness of breath.   Azelastine-Fluticasone 137-50 MCG/ACT Susp Commonly known as: Dymista Place 2 sprays into the nose in the morning and at bedtime. INSTILL TWO SPRAYS INTO BOTH NOSTRILS DAILY What changed:  when to take this reasons to take this additional instructions   Biotin 1000 MCG tablet Take 1,000 mcg by mouth daily.   CALCIUM-VITAMIN D PO Take 1 tablet by mouth daily.   chlorthalidone 25 MG tablet Commonly known as: HYGROTON TAKE ONE TABLET BY MOUTH EVERY DAY - PT. NEEDS OFFICE VISIT What changed: See the new instructions.   EPINEPHrine 0.3 mg/0.3 mL Soaj injection Commonly known as: EPI-PEN Inject one dose intramuscularly for allergic reaction. May repeat one dose if needed after 5-15 minutes. Proceed to the ER   labetalol 200 MG tablet Commonly known as: NORMODYNE TAKE 1 TABLET BY MOUTH TWICE DAILY   lamoTRIgine 200 MG tablet Commonly known as: LAMICTAL Take 1 tablet (200 mg total) by mouth 2 (two) times daily.   levETIRAcetam 250 MG tablet  Commonly known as: KEPPRA Take 5 tablets (1,250 mg total) by mouth 2 (two) times daily. What changed:  medication strength how much to take   levocetirizine 5 MG tablet Commonly known as: XYZAL TAKE 1 TABLET BY MOUTH DAILY AS NEEDED FOR runny nose What changed: See the new instructions.   multivitamin tablet Take 1 tablet by mouth daily.   OVER THE COUNTER MEDICATION Take 900 mg by mouth daily. Echinacea goldenseal 900 mg   rosuvastatin 10 MG tablet Commonly known as: CRESTOR Take 10 mg by mouth daily.   traZODone 150 MG tablet Commonly known as: DESYREL Take 1 tablet (150 mg total) by mouth at bedtime.   UNABLE TO FIND Allergy Shot 1  per week   Valtoco 20 MG Dose 10 MG/0.1ML Lqpk Generic drug: diazePAM (20 MG Dose) Place 20 mg into the nose as needed (seizure lasting more than 2 minutes).   vitamin C with rose hips 500 MG tablet Take 500 mg by mouth daily.        Discharge Instructions/ Education: Discharge instructions given to patient/family with verbalized understanding. Discharge education completed with patient/family including: follow up instructions, medication list, discharge activities, and limitations if indicated. Additional discharge instructions as indicated by discharging provider also reviewed. Patient and family able to verbalize understanding, all questions fully answered. Patient instructed to return to Emergency Department, call 911, or call MD for any changes in condition.  Patient escorted while ambulatory to lobby and discharged home via private automobile.

## 2022-12-09 NOTE — Discharge Instructions (Addendum)
You were admitted to epilepsy monitoring unit between 12/05/2045/80/2024.  During this time, you underwent continuous video EEG monitoring.  We confirm the diagnosis of epilepsy, arising from left and right temporal region.  Additionally we recorded 2 seizures, 1 arising from left temporal region and one arising from right temporal region.  These findings were discussed with you.  We resumed your home dose of zonisamide and increase Keppra to 1250 mg twice daily.  We prescribed rescue medication intranasal Valtoco.  We also discussed seizure precautions including no driving for 6 months.  You have a follow-up appointment with your neurologist on 12/13/2022.

## 2022-12-09 NOTE — Progress Notes (Signed)
EMU LTM EEG discontinued - no skin breakdown at unhook.  °

## 2022-12-09 NOTE — Procedures (Addendum)
Patient Name: Taylor Collier  MRN: [id number]  Epilepsy Attending: Charlsie Quest  Referring Physician/Provider: Charlsie Quest, MD  Duration: 12/08/2022 1111 to 12/09/2022 4098   Patient history: 47 year old female admitted to epilepsy monitoring unit for characterization of spells. EEG to evaluate for seizure.    Level of alertness: Awake, asleep   AEDs during EEG study: LEV, ZNS   Technical aspects: This EEG study was done with scalp electrodes positioned according to the 10-20 International system of electrode placement. Electrical activity was reviewed with band pass filter of 1-70Hz , sensitivity of 7 uV/mm, display speed of 53mm/sec with a 60Hz  notched filter applied as appropriate. EEG data were recorded continuously and digitally stored.  Video monitoring was available and reviewed as appropriate.   Description: The posterior dominant rhythm consists of 10-11 Hz activity of moderate voltage (25-35 uV) seen predominantly in posterior head regions, symmetric and reactive to eye opening and eye closing. Sleep was characterized by vertex waves, sleep spindles (12 to 14 Hz), maximal frontocentral region. Independent sharp waves were noted in left> right frontal-anterior temporal region.    ABNORMALITY -Sharp wave,  left >right frontal-anterior temporal region   IMPRESSION: This study is consistent with patient's history of epilepsy arising from left and right frontal- anterior temporal region. No seizures were noted.  Celena Lanius Annabelle Harman

## 2022-12-13 ENCOUNTER — Telehealth: Payer: Self-pay | Admitting: Neurology

## 2022-12-13 ENCOUNTER — Ambulatory Visit: Payer: BC Managed Care – PPO | Admitting: Neurology

## 2022-12-13 ENCOUNTER — Encounter: Payer: Self-pay | Admitting: Neurology

## 2022-12-13 VITALS — BP 144/78 | HR 68 | Ht 64.0 in | Wt 275.8 lb

## 2022-12-13 DIAGNOSIS — G40309 Generalized idiopathic epilepsy and epileptic syndromes, not intractable, without status epilepticus: Secondary | ICD-10-CM

## 2022-12-13 DIAGNOSIS — G40A09 Absence epileptic syndrome, not intractable, without status epilepticus: Secondary | ICD-10-CM

## 2022-12-13 MED ORDER — LAMOTRIGINE 200 MG PO TABS: 200.0000 mg | ORAL_TABLET | Freq: Two times a day (BID) | ORAL | 3 refills | Status: DC

## 2022-12-13 MED ORDER — LEVETIRACETAM 250 MG PO TABS: 1250.0000 mg | ORAL_TABLET | Freq: Two times a day (BID) | ORAL | 2 refills | Status: DC

## 2022-12-13 NOTE — Telephone Encounter (Signed)
Pt discussed at the visit today completing a at home EEG for 3 days. Order submitted to [facility] and received confirmation of fax that order, notes and test results were received.

## 2022-12-13 NOTE — Addendum Note (Signed)
Addended by: Melvyn Novas on: 12/13/2022 08:41 AM   Modules accepted: Orders

## 2022-12-13 NOTE — Progress Notes (Signed)
Provider:  Melvyn Novas, MD  Primary Care Physician:  Juliette Alcide, MD [address] [place] [address]     Referring Provider: Juliette Alcide, Md [address] [place],  [place] [address]          Chief Complaint according to patient   Patient presents with:     POST EMU visit  Patient (Initial Visit)           HISTORY OF PRESENT ILLNESS:  Taylor Collier is a 47 y.o. female patient who is here for revisit 12/13/2022 for EPILEPSY .  Chief concern according to patient :  During EMU stay with Dr Melynda Ripple she had an abnormal EEG activity and was re-titrated to Keppra. Now on 1250 mg Bid and this week seizure free. Se here the summary of EEG and EMU stay.    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  During this time, she underwent continuous video EEG monitoring.  Antiseizure medications were held.  HV and photic stimulation were performed.  Her EEG showed evidence of epilepsy arising from left and right frontal-anterior temporal region.  One seizure was noted on 12/07/2022 at 0447 arising from  left frontal-anterior temporal region.  During the seizure no clinical signs were noted.  Seizure lasted for about 1 minute 50 seconds. One seizure on 12/07/2022 at 1352 arising from right posterior temporal region during which patient was talking to her friend, no clinical signs were noted.  Seizure lasted for about 4 minutes.  Home lamotrigine was resumed and Keppra was increased to 1250 mg twice daily.  Intranasal Valtoco was also prescribed for rescue.  Seizure precaution including do not drive or discussed.   She will follow-up with Dr. Vickey Huger on 12/13/2022 at 8 AM.  Can consider ambulatory EEG in a few weeks to make sure patient remains seizure-free  because both her seizures were subclinical.     Seizure precautions including do not drive have been discussed   Consults: None   Significant Diagnostic Studies: EEG   The posterior dominant rhythm consists of 10-11 Hz activity of moderate voltage (25-35 uV) seen predominantly in posterior head regions, symmetric and reactive to eye opening and eye closing. Sleep was characterized by vertex waves, sleep spindles (12 to 14 Hz), maximal frontocentral region. Independent sharp waves were noted in left> right frontal-anterior temporal region.    Seizure was noted arising from left frontal-anterior temporal region.  At the onset of seizure, patient was sleeping (snoring).  Patient appeared to move in bed, scratching her nose and briefly coughing at the end of the seizure. No other clinical signs were noted.  Concomitant EEG showed 3 Hz rhythmic delta slowing in left frontal-anterior temporal region.  EEG gradually evolved into 4 to 5 Hz sharply contoured theta slowing and involved all of left hemisphere.  Subsequently EEG showed 3 to 5 Hz theta with delta slowing admixed with sharp waves, maximal left frontal- anterior temporal region. Onset of seizure was on 12/07/2022 at 0447, total duration 1 minute 50 seconds.    Seizure was noted arising from right posterior temporal region.  During the seizure, patient was sitting up in bed talking to a visitor in the room. Throughout the seizure, patient continued to participate in  the conversation with the visitor. She was also noted to be scrolling on her phone with her right hand.  At times she was noted to have what looked like automatisms with the left hand briefly and at 1 point was also noted to be looking to the left side (however the window was also on the left side so unclear if she was just looking outside the window).  At the onset, EEG showed 5 to 6 Hz theta slowing in right posterior temporal region which then became more sharply contoured and involved  all of right hemisphere as well as left posterior temporal region.  Subsequently EEG evolved into 2 to 3 Hz delta slowing admixed with spikes in right hemisphere, maximal right posterior temporal region. Onset of seizure was on 12/07/2022 at 1352, total duration was about 4 minutes.   Photic driving was not seen during photic stimulation. No EEG change was seen during hyperventilation.    ABNORMALITY - Focal seizure, left frontal-anterior temporal region  -Focal seizure, right posterior temporal region -Sharp wave,  left >right frontal-anterior temporal region   IMPRESSION: This study showed 1 seizure on 12/07/2022 at 0447 arising from  left frontal-anterior temporal region.  During the seizure no clinical signs were noted.  Seizure lasted for about 1 minute 50 seconds. Second seizure was noted on 12/07/2022 at 1352 arising from right posterior temporal region as described above. Seizure lasted for about 4 minutes. Additionally EEG showed independent epileptogenicity arising from  left > right frontal-anterior temporal region.       Treatments: Lamotrigine 200 mg twice daily, increase Keppra to 1250 mg twice daily  Taylor Collier is a 47 y.o. female with past medical history of hypertension, migraines, epilepsy was admitted to epilepsy monitoring unit for characterization of spells.   Patient states states she had her first seizure in February 2010.  Described as generalized tonic-clonic seizure.  She was not started on any antiseizure medications.  Subsequently in 2011 she had 2 generalized tonic-clonic seizures and was started on lamotrigine.  She had another generalized tonic-clonic seizure in 2023 and Keppra was added.  States no further generalized tonic-clonic seizure since then.  However in the last 6 months to 1 year, patient's husband has noticed that she appears to be staring off at times, is able to answer questions but does not remember the interaction later.  Also asks questions again and  again sometimes, without any memory of having the conversation before.  States these episodes last for just a couple of minutes.  States however she feels confused for multiple hours after that.  Denies any clear warning signs.  Denies any shaking, twitching.   Epilepsy risk factors: Marginal delivery, born 1 month earlier and had to stay in the ICU, denies any meningitis/encephalitis, denies febrile seizures, denies family history of epilepsy, denies brain injury with loss of consciousness   Current AEDs: Lamotrigine 200 mg twice daily Keppra 750 mg twice daily   No other AEDs in the past   EEG 01/05/2022: abnormal EEG recording in the waking and sleeping state due to intermittent right temporoparietal focal slowing. 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      11-06-2022: CD MRI review. Small lesions in the subcortical brain, these are referred to as Foci.  The appearance is not typical for demyelination/ MS.  None of the foci enhance or appear to be acute. These can be seen in migraine patients and patients with HTN.  Nothing in this MRI indicates a focus for absence seizures as seen on EEG. I like to order an EMU stay ,  Symptoms:  Amnestic spells, confusion in post ictum-  described as repetitively asking the same questions.    Most likely related to seizure activity, she has been treated for Epilepsy by Dr Anne Hahn since 06-2008, TOC in 2023. She had been very anxious , tearful, and advised me that she was claustrophobic and afraid of a new MRI.  I am very glad she allowed [place] to obtain these images .      Review of Systems: Out of a complete 14 system review, the patient complains of only the following symptoms, and all other reviewed systems are negative.:    HST not quoted.   Social History   Socioeconomic History   Marital status: Married    Spouse name: Not on file   Number of children: 2   Years of education: MA   Highest  education level: Not on file  Occupational History   Occupation: Runner, broadcasting/film/video  Tobacco Use   Smoking status: Former    Current packs/day: 0.00    Average packs/day: 0.3 packs/day for 3.0 years (0.8 ttl pk-yrs)    Types: Cigarettes    Start date: 09/23/1994    Quit date: 09/22/1997    Years since quitting: 25.2   Smokeless tobacco: Never   Tobacco comments:    quit 2003  Vaping Use   Vaping status: Never Used  Substance and Sexual Activity   Alcohol use: No    Comment: on occasion   Drug use: Yes    Types: Psilocybin   Sexual activity: Yes  Other Topics Concern   Not on file  Social History Narrative   Pt lives at home, married   Patient drinks 2-3 cups of caffeine daily.   Patient is right handed.   Social Determinants of Health   Financial Resource Strain: Not on file  Food Insecurity: No Food Insecurity (12/06/2022)   Hunger Vital Sign    Worried About Running Out of Food in the Last Year: Never true    Ran Out of Food in the Last Year: Never true  Transportation Needs: No Transportation Needs (12/06/2022)   PRAPARE - Administrator, Civil Service (Medical): No    Lack of Transportation (Non-Medical): No  Physical Activity: Insufficiently Active (12/01/2021)   [facility]   Exercise Vital Sign    Days of Exercise per Week: 1 day    Minutes of Exercise per Session: 30 min  Stress: No Stress Concern Present (12/01/2021)   Received from [facility], [facility] of Occupational Health - Occupational Stress Questionnaire    Feeling of Stress : Only a little  Social Connections: Not on file    Family History  Problem Relation Age of Onset   Colon cancer Mother    Aneurysm Father    Breast cancer Sister    Diabetes Paternal Aunt    Alzheimer's disease Maternal Grandmother    Uterine cancer Maternal Grandmother     Past Medical History:  Diagnosis Date   Anxiety    Apnea    witnessed with snoring    Chronic insomnia 12/24/2014   Common migraine with intractable migraine 12/26/2019   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     albuterol (VENTOLIN HFA) 108 (90 Base) MCG/ACT inhaler Inhale 2 puffs into the lungs as needed for wheezing or shortness of breath.     Ascorbic Acid (VITAMIN C WITH ROSE HIPS) 500 MG tablet Take 500 mg by mouth daily.     Azelastine-Fluticasone (DYMISTA) 137-50 MCG/ACT SUSP Place 2 sprays into the nose in the morning and at bedtime. INSTILL TWO SPRAYS INTO BOTH NOSTRILS DAILY (Patient taking differently: Place 2 sprays into the nose as needed (allergies).) 23 g 5   Biotin 1000 MCG tablet Take 1,000 mcg by mouth daily.     CALCIUM-VITAMIN D PO Take 1 tablet by mouth daily.     chlorthalidone (HYGROTON) 25 MG tablet TAKE ONE TABLET BY MOUTH EVERY DAY - PT. NEEDS OFFICE VISIT (Patient taking differently: Take 25 mg by mouth daily.) 7 tablet 0   diazePAM, 20 MG Dose, (VALTOCO 20 MG DOSE) 2 x 10 MG/0.1ML LQPK Place 20 mg into the nose as needed (seizure lasting more than 2 minutes). 4 each 0   EPINEPHrine 0.3 mg/0.3 mL IJ SOAJ injection Inject one dose intramuscularly for allergic reaction. May repeat one dose if needed after 5-15 minutes. Proceed to the ER 1 each 1   labetalol (NORMODYNE) 200 MG tablet TAKE 1 TABLET BY MOUTH TWICE DAILY 4 tablet 0   lamoTRIgine (LAMICTAL) 200 MG tablet Take 1 tablet (200 mg total) by mouth 2 (two) times daily. 180 tablet 3   levETIRAcetam (KEPPRA) 250 MG tablet Take 5 tablets (1,250 mg total)  by mouth 2 (two) times daily. 300 tablet 2   levocetirizine (XYZAL) 5 MG tablet TAKE 1 TABLET BY MOUTH DAILY AS NEEDED FOR runny nose (Patient taking differently: Take 5 mg by mouth every evening.) 30 tablet 5   Multiple Vitamin (MULTIVITAMIN) tablet Take 1 tablet by mouth daily.     OVER THE COUNTER MEDICATION Take 900 mg by mouth daily. Echinacea goldenseal 900 mg     rosuvastatin (CRESTOR) 10 MG tablet Take 10 mg by mouth daily.     traZODone (DESYREL) 150 MG tablet Take 1 tablet (150 mg total) by mouth at bedtime. 90 tablet 3   UNABLE TO FIND Allergy Shot 1 per week     No current facility-administered medications on file prior to visit.    Allergies  Allergen Reactions   Amoxicillin-Pot Clavulanate Rash   Penicillins Rash     DIAGNOSTIC DATA (LABS, IMAGING, TESTING) - I reviewed patient records, labs, notes, testing and imaging myself where available.  Lab Results  Component Value Date   WBC 8.6 12/06/2022   HGB 12.4 12/06/2022   HCT 36.9 12/06/2022   MCV 87.9 12/06/2022   PLT 302 12/06/2022      Component Value Date/Time   NA 139 12/06/2022 1110   NA 137 12/25/2020 1005   K 3.6 12/06/2022 1110   CL 104 12/06/2022 1110   CO2 25 12/06/2022 1110   GLUCOSE 121 (H) 12/06/2022 1110   BUN 5 (L) 12/06/2022 1110   BUN 14 12/25/2020 1005   CREATININE 0.75 12/06/2022 1110   CALCIUM 9.4  12/06/2022 1110   PROT 6.4 (L) 12/06/2022 1110   PROT 7.1 12/25/2020 1005   ALBUMIN 3.6 12/06/2022 1110   ALBUMIN 4.6 12/25/2020 1005   AST 31 12/06/2022 1110   ALT 37 12/06/2022 1110   ALKPHOS 53 12/06/2022 1110   BILITOT 0.6 12/06/2022 1110   BILITOT 0.5 12/25/2020 1005   GFRNONAA >60 12/06/2022 1110   GFRAA 111 12/26/2019 1024   No results found for: "CHOL", "HDL", "LDLCALC", "LDLDIRECT", "TRIG", "CHOLHDL" No results found for: "HGBA1C" No results found for: "VITAMINB12" No results found for: "TSH"  PHYSICAL EXAM:  Today's Vitals   12/13/22 0738  BP: (!) 144/78  Pulse: 68   Weight: 275 lb 12.8 oz (125.1 kg)  Height: 5\' 4"  (1.626 m)   Body mass index is 47.34 kg/m.   Wt Readings from Last 3 Encounters:  12/13/22 275 lb 12.8 oz (125.1 kg)  12/06/22 285 lb 7.9 oz (129.5 kg)  09/07/22 269 lb 3.2 oz (122.1 kg)     Ht Readings from Last 3 Encounters:  12/13/22 5\' 4"  (1.626 m)  12/06/22 5\' 4"  (1.626 m)  09/07/22 5\' 4"  (1.626 m)      General: The patient is awake, alert and appears not in acute distress. The patient is well groomed. Head: Normocephalic, atraumatic.  Neck is supple.  Normocephalic, atraumatic. Neck is supple. Mallampati 1,  neck circumference:17 inches .  Nasal airflow barley patent.  Retrognathia is not seen.  Dental status: biological      The patient is alert and cooperative at the time of the examination. The patient is moderately to markedly obese.   Skin: No significant peripheral edema is noted.   Neurologic Exam   Mental status: The patient is tearful, concerned- reported  amnestic events.  alert and oriented x 3 at the time of the examination. The patient has apparent normal remote memory, with an apparently normal attention span and concentration ability.     Cranial nerves: Facial symmetry is present. Speech is normal, no aphasia or dysarthria is noted. Extraocular movements are full. Visual fields are full.   Motor: The patient has good strength in all 4 extremities.   Sensory examination: Soft touch sensation is symmetric on the face, arms, and legs.   Coordination: The patient has good finger-nose-finger and heel-to-shin bilaterally.   Gait and station: The patient has a normal gait. Tandem gait is normal. Romberg is negative. No drift is seen.   Reflexes: Deep tendon reflexes are symmetric.        ASSESSMENT AND PLAN 47 y.o. year old female  here with:    1)  seizure disorder , followed by dr Anne Hahn since 06-2008, and then had 3 seizures in 2013, and now has abnormal EEGs, now abnormal findings in EMU  recording , left and right brain involved.   2) I will order an ambulatory EEG upon Dr Waldemar Dickens recommendation   3)  will continue current level of Medications.   I plan to follow up through our NP within 4 months.   I would like to thank Dr Melynda Ripple and Juliette Alcide, Md [address] [place],  [place] [address] for allowing me to meet with and to take care of this pleasant patient.   CC: I will share my notes with Dr Teresa Coombs .  After spending a total time of  20  minutes face to face and additional time for physical and neurologic examination, review of laboratory studies,  personal review of imaging studies, reports and  results of other testing and review of referral information / records as far as provided in visit,   Electronically signed by: Melvyn Novas, MD 12/13/2022 8:06 AM  [facility] and [facility] Board certified by The [employer] of Sleep Medicine and Diplomate of the [employer] of Sleep Medicine. Board certified In Neurology through the ABPN, Fellow of the [employer] of Neurology.

## 2022-12-22 ENCOUNTER — Ambulatory Visit (INDEPENDENT_AMBULATORY_CARE_PROVIDER_SITE_OTHER): Payer: BC Managed Care – PPO

## 2022-12-22 DIAGNOSIS — J309 Allergic rhinitis, unspecified: Secondary | ICD-10-CM | POA: Diagnosis not present

## 2022-12-23 DIAGNOSIS — J3089 Other allergic rhinitis: Secondary | ICD-10-CM | POA: Diagnosis not present

## 2022-12-23 NOTE — Progress Notes (Signed)
VIAL EXP 12-23-23

## 2022-12-30 ENCOUNTER — Ambulatory Visit: Payer: BC Managed Care – PPO | Admitting: Neurology

## 2022-12-31 ENCOUNTER — Ambulatory Visit (INDEPENDENT_AMBULATORY_CARE_PROVIDER_SITE_OTHER): Payer: BC Managed Care – PPO

## 2022-12-31 DIAGNOSIS — J309 Allergic rhinitis, unspecified: Secondary | ICD-10-CM | POA: Diagnosis not present

## 2023-01-07 ENCOUNTER — Ambulatory Visit (INDEPENDENT_AMBULATORY_CARE_PROVIDER_SITE_OTHER): Payer: BC Managed Care – PPO

## 2023-01-07 DIAGNOSIS — J309 Allergic rhinitis, unspecified: Secondary | ICD-10-CM

## 2023-01-13 ENCOUNTER — Other Ambulatory Visit: Payer: Self-pay | Admitting: Neurology

## 2023-01-14 ENCOUNTER — Ambulatory Visit (INDEPENDENT_AMBULATORY_CARE_PROVIDER_SITE_OTHER): Payer: BC Managed Care – PPO

## 2023-01-14 DIAGNOSIS — J309 Allergic rhinitis, unspecified: Secondary | ICD-10-CM | POA: Diagnosis not present

## 2023-01-20 ENCOUNTER — Other Ambulatory Visit: Payer: Self-pay | Admitting: Neurology

## 2023-01-28 ENCOUNTER — Ambulatory Visit (INDEPENDENT_AMBULATORY_CARE_PROVIDER_SITE_OTHER): Payer: BC Managed Care – PPO

## 2023-01-28 DIAGNOSIS — J309 Allergic rhinitis, unspecified: Secondary | ICD-10-CM | POA: Diagnosis not present

## 2023-02-09 ENCOUNTER — Ambulatory Visit (INDEPENDENT_AMBULATORY_CARE_PROVIDER_SITE_OTHER): Payer: Self-pay

## 2023-02-09 DIAGNOSIS — J309 Allergic rhinitis, unspecified: Secondary | ICD-10-CM

## 2023-02-10 ENCOUNTER — Other Ambulatory Visit: Payer: Self-pay | Admitting: Family

## 2023-02-18 ENCOUNTER — Ambulatory Visit (INDEPENDENT_AMBULATORY_CARE_PROVIDER_SITE_OTHER): Payer: Self-pay

## 2023-02-18 DIAGNOSIS — J309 Allergic rhinitis, unspecified: Secondary | ICD-10-CM

## 2023-02-22 ENCOUNTER — Telehealth: Payer: Self-pay | Admitting: Neurology

## 2023-02-22 NOTE — Telephone Encounter (Signed)
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  Pt is asking if there is a # that she can call or if RN can get the company to call her in response to her request for this being done over Thanksgiving or Christmas.

## 2023-02-22 NOTE — Telephone Encounter (Signed)
Called the pt and advised that she can call is [phone]. She will call them and try and reschedule.

## 2023-02-23 ENCOUNTER — Ambulatory Visit (INDEPENDENT_AMBULATORY_CARE_PROVIDER_SITE_OTHER): Payer: BC Managed Care – PPO

## 2023-02-23 DIAGNOSIS — J309 Allergic rhinitis, unspecified: Secondary | ICD-10-CM

## 2023-03-03 ENCOUNTER — Encounter: Payer: Self-pay | Admitting: Allergy & Immunology

## 2023-03-07 ENCOUNTER — Other Ambulatory Visit: Payer: Self-pay | Admitting: Family

## 2023-03-09 ENCOUNTER — Ambulatory Visit (INDEPENDENT_AMBULATORY_CARE_PROVIDER_SITE_OTHER): Payer: BC Managed Care – PPO

## 2023-03-09 DIAGNOSIS — J309 Allergic rhinitis, unspecified: Secondary | ICD-10-CM | POA: Diagnosis not present

## 2023-03-09 NOTE — Telephone Encounter (Signed)
Patient made a telehealth visit for 03/24/2023 with [facility]. Can we send in a courtesy refill?

## 2023-03-23 ENCOUNTER — Ambulatory Visit (INDEPENDENT_AMBULATORY_CARE_PROVIDER_SITE_OTHER): Payer: BC Managed Care – PPO

## 2023-03-23 DIAGNOSIS — J309 Allergic rhinitis, unspecified: Secondary | ICD-10-CM

## 2023-03-23 MED ORDER — EPINEPHRINE 0.3 MG/0.3ML IJ SOAJ: INTRAMUSCULAR | 1 refills | Status: AC

## 2023-03-24 ENCOUNTER — Ambulatory Visit: Payer: BC Managed Care – PPO | Admitting: Family Medicine

## 2023-03-24 ENCOUNTER — Encounter: Payer: Self-pay | Admitting: Family Medicine

## 2023-03-24 ENCOUNTER — Other Ambulatory Visit: Payer: Self-pay | Admitting: Family

## 2023-03-24 DIAGNOSIS — J309 Allergic rhinitis, unspecified: Secondary | ICD-10-CM

## 2023-03-24 DIAGNOSIS — J329 Chronic sinusitis, unspecified: Secondary | ICD-10-CM | POA: Diagnosis not present

## 2023-03-24 MED ORDER — LEVOCETIRIZINE DIHYDROCHLORIDE 5 MG PO TABS: 5.0000 mg | ORAL_TABLET | Freq: Every day | ORAL | 5 refills | Status: DC | PRN

## 2023-03-24 NOTE — Patient Instructions (Signed)
Asthma Continue Airsupra 2 puffs as needed for cough or wheeze.  Do not use more than 12 puffs in a 24-hour.  Allergic rhinitis Continue allergen avoidance measures directed toward tree pollen, weed pollen, dust mite, and dog as listed below Continue allergen immunotherapy and have access to an epinephrine autoinjector set Continue Xyzal 5 mg once a day if needed for runny nose or itch Continue Flonase 2 sprays in each nostril once a day for stuffy nose Consider saline nasal rinses as needed for nasal symptoms. Use this before any medicated nasal sprays for best result  Recurrent sinusitis Keep track of antibiotic use, steroid use, and infections. A lab order has been placed to help [place] evaluate your immune system.  We will call you when the results become available.  Call the clinic if this treatment plan is not working well for you.  Follow up in 3 months or sooner if needed.  Reducing Pollen Exposure The American Academy of Allergy, Asthma and Immunology suggests the following steps to reduce your exposure to pollen during allergy seasons. Do not hang sheets or clothing out to dry; pollen may collect on these items. Do not mow lawns or spend time around freshly cut grass; mowing stirs up pollen. Keep windows closed at night.  Keep car windows closed while driving. Minimize morning activities outdoors, a time when pollen counts are usually at their highest. Stay indoors as much as possible when pollen counts or humidity is high and on windy days when pollen tends to remain in the air longer. Use air conditioning when possible.  Many air conditioners have filters that trap the pollen spores. Use a HEPA room air filter to remove pollen form the indoor air you breathe.   Control of Dust Mite Allergen Dust mites play a major role in allergic asthma and rhinitis. They occur in environments with high humidity wherever human skin is found. Dust mites absorb humidity from the atmosphere (ie,  they do not drink) and feed on organic matter (including shed human and animal skin). Dust mites are a microscopic type of insect that you cannot see with the naked eye. High levels of dust mites have been detected from mattresses, pillows, carpets, upholstered furniture, bed covers, clothes, soft toys and any woven material. The principal allergen of the dust mite is found in its feces. A gram of dust may contain 1,000 mites and 250,000 fecal particles. Mite antigen is easily measured in the air during house cleaning activities. Dust mites do not bite and do not cause harm to humans, other than by triggering allergies/asthma.  Ways to decrease your exposure to dust mites in your home:  1. Encase mattresses, box springs and pillows with a mite-impermeable barrier or cover  2. Wash sheets, blankets and drapes weekly in hot water (130 F) with detergent and dry them in a dryer on the hot setting.  3. Have the room cleaned frequently with a vacuum cleaner and a damp dust-mop. For carpeting or rugs, vacuuming with a vacuum cleaner equipped with a high-efficiency particulate air (HEPA) filter. The dust mite allergic individual should not be in a room which is being cleaned and should wait 1 hour after cleaning before going into the room.  4. Do not sleep on upholstered furniture (eg, couches).  5. If possible removing carpeting, upholstered furniture and drapery from the home is ideal. Horizontal blinds should be eliminated in the rooms where the person spends the most time (bedroom, study, television room). Washable vinyl, roller-type shades  are optimal.  6. Remove all non-washable stuffed toys from the bedroom. Wash stuffed toys weekly like sheets and blankets above.  7. Reduce indoor humidity to less than 50%. Inexpensive humidity monitors can be purchased at most hardware stores. Do not use a humidifier as can make the problem worse and are not recommended.  Control of Dog or Cat Allergen Avoidance  is the best way to manage a dog or cat allergy. If you have a dog or cat and are allergic to dog or cats, consider removing the dog or cat from the home. If you have a dog or cat but don't want to find it a new home, or if your family wants a pet even though someone in the household is allergic, here are some strategies that may help keep symptoms at bay:  Keep the pet out of your bedroom and restrict it to only a few rooms. Be advised that keeping the dog or cat in only one room will not limit the allergens to that room. Don't pet, hug or kiss the dog or cat; if you do, wash your hands with soap and water. High-efficiency particulate air (HEPA) cleaners run continuously in a bedroom or living room can reduce allergen levels over time. Regular use of a high-efficiency vacuum cleaner or a central vacuum can reduce allergen levels. Giving your dog or cat a bath at least once a week can reduce airborne allergen.

## 2023-03-26 ENCOUNTER — Encounter: Payer: Self-pay | Admitting: Neurology

## 2023-04-06 ENCOUNTER — Ambulatory Visit (INDEPENDENT_AMBULATORY_CARE_PROVIDER_SITE_OTHER): Payer: Self-pay

## 2023-04-06 DIAGNOSIS — J309 Allergic rhinitis, unspecified: Secondary | ICD-10-CM | POA: Diagnosis not present

## 2023-04-12 ENCOUNTER — Other Ambulatory Visit: Payer: Self-pay | Admitting: Neurology

## 2023-04-20 ENCOUNTER — Ambulatory Visit (INDEPENDENT_AMBULATORY_CARE_PROVIDER_SITE_OTHER): Payer: BC Managed Care – PPO | Admitting: *Deleted

## 2023-04-20 DIAGNOSIS — J309 Allergic rhinitis, unspecified: Secondary | ICD-10-CM

## 2023-04-27 ENCOUNTER — Telehealth: Payer: Self-pay | Admitting: Neurology

## 2023-04-27 DIAGNOSIS — J3089 Other allergic rhinitis: Secondary | ICD-10-CM | POA: Diagnosis not present

## 2023-04-27 NOTE — Telephone Encounter (Signed)
Pt's husband, Alazae Sawhney. 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 EMS called me and said she coming out of it, but was confused. In ER, running test right now. Pt had no injuries. Have schedule appt for tomorrow at 8:30 am. Mr. Georgiadis said can call my cellphone [phone]

## 2023-04-27 NOTE — Progress Notes (Signed)
VIAL EXP 06-17-23 

## 2023-04-28 ENCOUNTER — Encounter: Payer: Self-pay | Admitting: Neurology

## 2023-04-28 ENCOUNTER — Ambulatory Visit: Payer: BC Managed Care – PPO | Admitting: Neurology

## 2023-04-28 VITALS — BP 156/104 | HR 72 | Ht 64.0 in | Wt 265.0 lb

## 2023-04-28 DIAGNOSIS — R413 Other amnesia: Secondary | ICD-10-CM | POA: Insufficient documentation

## 2023-04-28 DIAGNOSIS — Z5181 Encounter for therapeutic drug level monitoring: Secondary | ICD-10-CM

## 2023-04-28 DIAGNOSIS — G40A09 Absence epileptic syndrome, not intractable, without status epilepticus: Secondary | ICD-10-CM | POA: Diagnosis not present

## 2023-04-28 DIAGNOSIS — G40A11 Absence epileptic syndrome, intractable, with status epilepticus: Secondary | ICD-10-CM

## 2023-04-28 MED ORDER — VALTOCO 20 MG DOSE 10 MG/0.1ML NA LQPK: 20.0000 mg | NASAL | 2 refills | Status: AC | PRN

## 2023-04-28 MED ORDER — BRIVARACETAM 100 MG PO TABS: 100.0000 mg | ORAL_TABLET | Freq: Two times a day (BID) | ORAL | 5 refills | Status: DC

## 2023-04-28 NOTE — Telephone Encounter (Signed)
Dr. Vickey Huger asked me to call patient and let her know that patient is scheduled 12/27-12/30 for her amb EEG. Patient reports that she already knows that.

## 2023-04-28 NOTE — Progress Notes (Addendum)
Provider:  Melvyn Novas, MD  Primary Care Physician:  Juliette Alcide, MD [address] [place] [address]     Referring Provider: Juliette Alcide, Md [address] [place],  [place] [address]          Chief Complaint according to patient   Patient presents with:     New Patient (Initial Visit)           HISTORY OF PRESENT ILLNESS:  Taylor Collier is a 47 y.o. female patient who is here for revisit 04/28/2023 for  seizure activity.  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 I was described as having starred off and then starting to count fruits- but there were none to count. I was reportedly given nasal spray by a fellow teacher, but I woke up much later , on a stretcher"  My husband is very concerned abut my memory independently of seizures. I have been meeting a couple, the woman has been a long time friend and last July I was meeting him but I could not remember the encounter! My grandmother was only in her early 24 when she developed dementia, supposingly Alzheimerrs'       Last encounter  Taylor Collier is a 47 y.o. female patient who is here for revisit 12/13/2022 for EPILEPSY .  Chief concern according to patient :  During EMU stay with Dr Melynda Ripple she had an abnormal EEG activity and was re-titrated to Keppra. Now on 1250 mg Bid and this week seizure free. See here the summary of EEG and EMU stay.      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  During this time, she underwent continuous video EEG monitoring.  Antiseizure medications were held.  HV and photic stimulation were performed.  Her EEG showed evidence of epilepsy arising from left and right frontal-anterior temporal region.  One  seizure was noted on 12/07/2022 at 0447 arising from  left frontal-anterior temporal region.  During the seizure no clinical signs were noted.  Seizure lasted for about 1 minute 50 seconds. One seizure on 12/07/2022 at 1352 arising from right posterior temporal region during which patient was talking to her friend, no clinical signs were noted.  Seizure lasted for about 4 minutes.  Home lamotrigine was resumed and Keppra was increased to 1250 mg twice daily.  Intranasal Valtoco was also prescribed for rescue.  Seizure precaution including do not drive or discussed.   She will follow-up with Dr. Vickey Huger on 12/13/2022 at 8 AM.  Can consider ambulatory EEG in a few weeks to make sure patient remains seizure-free because both her seizures were subclinical.      Seizure precautions including do not drive have been discussed   Consults: None   Significant Diagnostic Studies: EEG   The posterior dominant rhythm consists of 10-11 Hz activity of moderate voltage (25-35 uV) seen predominantly in posterior head regions, symmetric and reactive to eye opening and eye closing. Sleep was characterized by vertex waves, sleep spindles (12 to 14 Hz), maximal frontocentral region. Independent sharp waves were noted in left> right frontal-anterior temporal region.    Seizure was noted arising from left frontal-anterior temporal region.  At the onset  of seizure, patient was sleeping (snoring).  Patient appeared to move in bed, scratching her nose and briefly coughing at the end of the seizure. No other clinical signs were noted.  Concomitant EEG showed 3 Hz rhythmic delta slowing in left frontal-anterior temporal region.  EEG gradually evolved into 4 to 5 Hz sharply contoured theta slowing and involved all of left hemisphere.  Subsequently EEG showed 3 to 5 Hz theta with delta slowing admixed with sharp waves, maximal left frontal- anterior temporal region. Onset of seizure was on 12/07/2022 at 0447, total duration 1 minute 50  seconds.    Seizure was noted arising from right posterior temporal region.  During the seizure, patient was sitting up in bed talking to a visitor in the room. Throughout the seizure, patient continued to participate in the conversation with the visitor. She was also noted to be scrolling on her phone with her right hand.  At times she was noted to have what looked like automatisms with the left hand briefly and at 1 point was also noted to be looking to the left side (however the window was also on the left side so unclear if she was just looking outside the window).  At the onset, EEG showed 5 to 6 Hz theta slowing in right posterior temporal region which then became more sharply contoured and involved all of right hemisphere as well as left posterior temporal region.  Subsequently EEG evolved into 2 to 3 Hz delta slowing admixed with spikes in right hemisphere, maximal right posterior temporal region. Onset of seizure was on 12/07/2022 at 1352, total duration was about 4 minutes.   Photic driving was not seen during photic stimulation. No EEG change was seen during hyperventilation.     Review of Systems: Out of a complete 14 system review, the patient complains of only the following symptoms, and all other reviewed systems are negative.:     Social History   Socioeconomic History   Marital status: Married    Spouse name: Not on file   Number of children: 2   Years of education: MA   Highest education level: Not on file  Occupational History   Occupation: Runner, broadcasting/film/video  Tobacco Use   Smoking status: Former    Current packs/day: 0.00    Average packs/day: 0.3 packs/day for 3.0 years (0.8 ttl pk-yrs)    Types: Cigarettes    Start date: 09/23/1994    Quit date: 09/22/1997    Years since quitting: 25.6   Smokeless tobacco: Never   Tobacco comments:    quit 2003  Vaping Use   Vaping status: Never Used  Substance and Sexual Activity   Alcohol use: No    Comment: on occasion   Drug use: Yes     Types: Psilocybin   Sexual activity: Yes  Other Topics Concern   Not on file  Social History Narrative   Pt lives at home, married   Patient drinks 2-3 cups of caffeine daily.   Patient is right handed.   Social Determinants of Health   Financial Resource Strain: Not on file  Food Insecurity: No Food Insecurity (12/06/2022)   Hunger Vital Sign    Worried About Running Out of Food in the Last Year: Never true    Ran Out of Food in the Last Year: Never true  Transportation Needs: No Transportation Needs (12/06/2022)   PRAPARE - Administrator, Civil Service (Medical): No    Lack of Transportation (Non-Medical): No  Physical  Activity: Insufficiently Active (12/01/2021)   Received from [facility], [facility]   Exercise Vital Sign    Days of Exercise per Week: 1 day    Minutes of Exercise per Session: 30 min  Stress: No Stress Concern Present (12/01/2021)   Received from [facility], [facility] - Occupational Stress Questionnaire    Feeling of Stress : Only a little  Social Connections: Not on file    Family History  Problem Relation Age of Onset   Colon cancer Mother    Aneurysm Father    Breast cancer Sister    Diabetes Paternal Aunt    Alzheimer's disease Maternal Grandmother    Uterine cancer Maternal Grandmother     Past Medical History:  Diagnosis Date   Anxiety    Apnea    witnessed with snoring   Chronic insomnia 12/24/2014   Common migraine with intractable migraine 12/26/2019   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     albuterol (VENTOLIN HFA) 108 (90 Base) MCG/ACT inhaler Inhale 2 puffs into the lungs as needed for wheezing or shortness of breath.     Ascorbic Acid (VITAMIN C WITH ROSE HIPS) 500 MG tablet Take 500 mg by mouth daily.     Azelastine-Fluticasone (DYMISTA) 137-50 MCG/ACT SUSP Place 2 sprays into the nose in the morning and at bedtime. INSTILL TWO SPRAYS INTO BOTH NOSTRILS DAILY (Patient taking differently: Place 2 sprays into the nose as needed (allergies).) 23 g 5   Biotin 1000 MCG tablet Take 1,000 mcg by mouth daily.     CALCIUM-VITAMIN D PO Take 1 tablet by mouth daily.     chlorthalidone (HYGROTON) 25 MG tablet TAKE ONE TABLET BY MOUTH EVERY DAY - PT. NEEDS OFFICE VISIT (Patient taking differently: Take 25 mg by mouth daily.) 7 tablet 0   diazePAM, 20 MG Dose, (VALTOCO 20 MG DOSE) 2 x 10 MG/0.1ML LQPK Place 20 mg into the nose as needed (seizure lasting more than 2 minutes). 4 each 0   EPINEPHrine 0.3 mg/0.3 mL IJ SOAJ injection Inject one dose intramuscularly for allergic reaction. May repeat one dose if needed after 5-15 minutes. Proceed to the ER 1 each 1   labetalol (NORMODYNE) 200 MG tablet TAKE 1 TABLET BY MOUTH TWICE DAILY 4 tablet 0   lamoTRIgine (LAMICTAL) 200 MG tablet TAKE 1 TABLET BY MOUTH TWICE DAILY 180 tablet 3   levETIRAcetam (KEPPRA) 250 MG tablet TAKE FIVE TABLETS BY MOUTH TWICE DAILY 300 tablet 2   levocetirizine (XYZAL) 5 MG tablet Take 1 tablet (5 mg total) by mouth daily as needed for allergies. TAKE 1 TABLET BY MOUTH DAILY AS NEEDED FOR runny nose 30 tablet 5   Multiple Vitamin (MULTIVITAMIN) tablet Take 1 tablet by mouth daily.     OVER THE COUNTER MEDICATION Take 900 mg by mouth daily. Echinacea goldenseal 900 mg     rosuvastatin (CRESTOR) 10 MG tablet Take  10 mg by mouth daily.     traZODone (DESYREL) 150 MG tablet TAKE 1 TABLET BY MOUTH AT BEDTIME 90 tablet 3   UNABLE TO FIND Allergy Shot  1 per week     No current facility-administered medications on file prior to visit.    Allergies  Allergen Reactions   Amoxicillin-Pot Clavulanate Rash   Penicillins Rash     DIAGNOSTIC DATA (LABS, IMAGING, TESTING) - I reviewed patient records, labs, notes, testing and imaging myself where available.  Lab Results  Component Value Date   WBC 8.6 12/06/2022   HGB 12.4 12/06/2022   HCT 36.9 12/06/2022   MCV 87.9 12/06/2022   PLT 302 12/06/2022      Component Value Date/Time   NA 139 12/06/2022 1110   NA 137 12/25/2020 1005   K 3.6 12/06/2022 1110   CL 104 12/06/2022 1110   CO2 25 12/06/2022 1110   GLUCOSE 121 (H) 12/06/2022 1110   BUN 5 (L) 12/06/2022 1110   BUN 14 12/25/2020 1005   CREATININE 0.75 12/06/2022 1110   CALCIUM 9.4 12/06/2022 1110   PROT 6.4 (L) 12/06/2022 1110   PROT 7.1 12/25/2020 1005   ALBUMIN 3.6 12/06/2022 1110   ALBUMIN 4.6 12/25/2020 1005   AST 31 12/06/2022 1110   ALT 37 12/06/2022 1110   ALKPHOS 53 12/06/2022 1110   BILITOT 0.6 12/06/2022 1110   BILITOT 0.5 12/25/2020 1005   GFRNONAA >60 12/06/2022 1110   GFRAA 111 12/26/2019 1024   No results found for: "CHOL", "HDL", "LDLCALC", "LDLDIRECT", "TRIG", "CHOLHDL" No results found for: "HGBA1C" No results found for: "VITAMINB12" No results found for: "TSH"  PHYSICAL EXAM:  Today's Vitals   04/28/23 0811  BP: (!) 156/104  Pulse: 72  Weight: 265 lb (120.2 kg)  Height: 5\' 4"  (1.626 m)   Body mass index is 45.49 kg/m.   Wt Readings from Last 3 Encounters:  04/28/23 265 lb (120.2 kg)  12/13/22 275 lb 12.8 oz (125.1 kg)  12/06/22 285 lb 7.9 oz (129.5 kg)     Ht Readings from Last 3 Encounters:  04/28/23 5\' 4"  (1.626 m)  12/13/22 5\' 4"  (1.626 m)  12/06/22 5\' 4"  (1.626 m)      General: General: The patient is awake, alert and appears not in acute distress. The patient is well groomed. Head: Normocephalic, atraumatic.  Neck is supple.  Normocephalic, atraumatic. Neck is supple.  Mallampati 1,  neck circumference:17 inches .  Nasal airflow barley patent.  Retrognathia is not seen.  Dental status: biological      The patient is alert and cooperative at the time of the examination. The patient is moderately to markedly obese.   Skin: No significant peripheral edema is noted.   Neurologic Exam   Mental status: The patient is almost tearful, concerned- reported  amnestic events.  alert and oriented x 3 at the time of the examination.  The patient has apparent normal remote memory, with an apparently normal attention span and concentration ability.  MOCA - 27/ 30 points.         Cranial nerves: Facial symmetry is present. Speech is normal, no aphasia or dysarthria is noted. Extraocular movements are full. Visual fields are full.   Motor: The patient has good strength in all 4 extremities.   Sensory examination: Soft touch sensation is symmetric on the face, arms, and legs.   Coordination: The patient has good finger-nose-finger and heel-to-shin bilaterally.   Gait and station: The patient has a normal  gait. Tandem gait is normal. Romberg is negative. No drift is seen.   Reflexes: Deep tendon reflexes are symmetric.         ASSESSMENT AND PLAN 47 y.o. year old female  here with: Break through seizure activity, non -convulsive/ primary generalized seizure with absence character. On Lamictal, Keppra and trazodone.  Patient BIB EMS after a witnessed seizure. 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 Students in her class got help, patient carries a diazepam rescue nasal spray for her seizures. Patient received two doses of this medication. Per EMS, patient was postictal upon arrival to the school. Per EMS, CBG 125. Patient states her last seizure was July 18th, but the last one before that was eleven and a half years ago. Patient takes Lamictal and Keppra twice a day and states she has not missed  any doses. Patient is seen at [facility]. Electronically signed by Reece Packer, RN at 04/27/2023 2:55 PM EST       1)  Seizure disorder , followed by Dr Anne Hahn since 06-2008, and then had 3 seizures in 2013, and now has abnormal EEGs, see [facility] notes / abnormal findings in EMU recording , left and right brain involved. She was 11 years seizure free prior to July 2024.     2) reordered an ambulatory EEG to be interpreted by Dr Teresa Coombs / per Dr Waldemar Dickens recommendation . I had ordered a HST for snoring but found it ot yet rleased/ done. She needs an in home ambulatory EEG.    3) I will change Medications. Started [employer] today. Convert from Keppra 1250 mg bid to 100 mg Breviact this months and may increase  to 125 on December 26th   4)  MOCA 27/ 30 with an interesting twist, the repetition of sentences was impaired.  I believe seizure activity, subclinical , is affecting her.    I took her out of work until January 2 nd. restarting on Friday January 3rd.      Follow up through our NP within 3-5 months, than yearly unless new seizure activity arises.   I would like to thank Juliette Alcide, Md [address] [place],  [place] [address] for allowing me to meet with and to take care of this pleasant patient.   Schedule ambulatory EEG over the Holiday with [employer] ( company stated they had reached out to her several times but she has not responded at [phone])  After spending a total time of  30  minutes face to face and additional time for physical and neurologic examination, review of laboratory studies,  personal review of imaging studies, reports and results of other testing and review of referral information / records as far as provided in visit,   Electronically signed by: Melvyn Novas, MD 04/28/2023 8:22 AM  [facility] Board certified by The [employer] of Sleep Medicine and Diplomate of the [employer] of Sleep  Medicine. Board certified In Neurology through the ABPN, Fellow of the [employer] of Neurology.

## 2023-04-28 NOTE — Telephone Encounter (Signed)
I called [employer]. They have reached about to patient several times since October at her phone number listed on file but the patient has not responded. They will call her again today.

## 2023-04-28 NOTE — Patient Instructions (Addendum)
Schedule ambulatory EEG over the Holiday with [employer] ( company stated they had reached out to her several times but she has not responded at [phone]).  Alternatively contact [facility] , they can schedule her for 12-27- 24 through 12-20 2024.   Re ordered ambulatory EEG-  Changing from keppra to breviact 100 mg bid po, d/c keppra .  May add another 25 mg bid by end of December.    Out of work note  immediately including January 2nd, return to work on Friday 05-27-2023.   Marland KitchenBreviact

## 2023-05-04 ENCOUNTER — Ambulatory Visit (INDEPENDENT_AMBULATORY_CARE_PROVIDER_SITE_OTHER): Payer: Self-pay

## 2023-05-04 DIAGNOSIS — J309 Allergic rhinitis, unspecified: Secondary | ICD-10-CM | POA: Diagnosis not present

## 2023-05-06 ENCOUNTER — Encounter: Payer: Self-pay | Admitting: Neurology

## 2023-05-10 ENCOUNTER — Encounter: Payer: Self-pay | Admitting: Neurology

## 2023-05-20 DIAGNOSIS — G40919 Epilepsy, unspecified, intractable, without status epilepticus: Secondary | ICD-10-CM | POA: Diagnosis not present

## 2023-05-20 DIAGNOSIS — R9401 Abnormal electroencephalogram [EEG]: Secondary | ICD-10-CM | POA: Diagnosis not present

## 2023-05-23 ENCOUNTER — Other Ambulatory Visit: Payer: Self-pay | Admitting: Allergy & Immunology

## 2023-05-23 ENCOUNTER — Encounter: Payer: Self-pay | Admitting: Neurology

## 2023-05-23 DIAGNOSIS — R9401 Abnormal electroencephalogram [EEG]: Secondary | ICD-10-CM

## 2023-05-23 DIAGNOSIS — G40919 Epilepsy, unspecified, intractable, without status epilepticus: Secondary | ICD-10-CM | POA: Diagnosis not present

## 2023-05-24 NOTE — Telephone Encounter (Signed)
I called and LMVM on husbands cell # that I called for more information on the seizure that pt had last night.

## 2023-05-26 ENCOUNTER — Telehealth: Payer: Self-pay | Admitting: Neurology

## 2023-05-26 NOTE — Telephone Encounter (Signed)
 Hi Taylor Collier,   My patient was wearing her amb. EEG at home when she has had a seizure and I like for you to look closer to the named time and date.   I quote my my chart message to her :   Taylor Collier,    I just spoke to your husband . He confirmed that the seizure activity took place Monday  in AM, and that you were a little late with intake of the morning medication that day , you took your meds at 9.30 AM, the confusional episode happened within about 2 hours , just before noon.   He gave me very important additional information. You had texted him while he was next door, your messages seemed alarming and confused to him, when he came back home you were surprised about the wires on your head and had all forgotten about the ambulatory EEG.    It is very good for our plan and assessment that the spell took place while you wore the long term EEG !   I will give all this information to Dr Gregg , And I want to add an increase in Breviact dose.   Dedra Gores, MD

## 2023-05-27 ENCOUNTER — Ambulatory Visit (INDEPENDENT_AMBULATORY_CARE_PROVIDER_SITE_OTHER): Payer: 59

## 2023-05-27 DIAGNOSIS — J309 Allergic rhinitis, unspecified: Secondary | ICD-10-CM | POA: Diagnosis not present

## 2023-05-31 NOTE — Telephone Encounter (Signed)
 EEG has not been uploaded yet for review. Will completed ASAP

## 2023-06-01 ENCOUNTER — Ambulatory Visit (INDEPENDENT_AMBULATORY_CARE_PROVIDER_SITE_OTHER): Payer: 59

## 2023-06-01 DIAGNOSIS — J309 Allergic rhinitis, unspecified: Secondary | ICD-10-CM | POA: Diagnosis not present

## 2023-06-06 ENCOUNTER — Other Ambulatory Visit: Payer: Self-pay | Admitting: Neurology

## 2023-06-06 ENCOUNTER — Encounter: Payer: Self-pay | Admitting: Neurology

## 2023-06-06 DIAGNOSIS — G40309 Generalized idiopathic epilepsy and epileptic syndromes, not intractable, without status epilepticus: Secondary | ICD-10-CM

## 2023-06-06 NOTE — Procedures (Signed)
 Clinical History  48 year old female with epilepsy. EMU stay in 11/2022 showed abnormal EEG with 1 seizure arising from the left frontal-anterior temporal region. During the seizure, no clinical signs were noted. The seizure lasted for about 1 minute and 50 seconds. Additionally, the EEG showed independent epileptogenicity arising from the left > right frontal-anterior temporal region.   INTERMITTENT MONITORING with VIDEO TECHNICAL SUMMARY: This AVEEG was performed using equipment provided by [employer] utilizing Bluetooth ( Trackit ) amplifiers with continuous EEGT attended video collection using encrypted remote transmission via [employer] secured cellular tower network with data rates for each AVEEG performed. This is a therapist, music AVEEG, obtained, according to the 10-20 international electrode placement system, reformatted digitally into referential and bipolar montages. Data was acquired with a minimum of 21 bipolar connections and sampled at a minimum rate of 250 cycles per second per channel, maximum rate of 450 cycles per second per channel and two channels for EKG. The entire VEEG study was recorded through cable and or radio telemetry for subsequent analysis. Specified epochs of the AVEEG data were identified at the direction of the subject by the depression of a push button by the patient. Each patients event file included data acquired two minutes prior to the push button activation and continuing until two minutes afterwards. AVEEG files were reviewed on [facility] server, Licensed Software provided by [employer] with a digital high frequency filter set at 70 Hz and a low frequency filter set at 1 Hz with a paper speed of 37mm/s resulting in 10 seconds per digital page. This entire AVEEG was reviewed by the EEG Technologist. Random time samples, random sleep samples, clips, patient initiated push button files with included patient daily diary logs, EEG Technologist  pruned data was reviewed and verified for accuracy and validity by the governing reading neurologist in full details. This AEEGV was fully compliant with all requirements for CPT 97500 for setup, patient education, take down and administered by an EEG technologist.   Long-Term EEG with Video was monitored intermittently by a qualified EEG technologist for the entirety of the recording; quality check-ins were performed at a minimum of every two hours, checking and documenting real-time data and video to assure the integrity and quality of the recording (e.g., camera position, electrode integrity and impedance), and identify the need for maintenance. For intermittent monitoring, an EEG Technologist monitored no more than 12 patients concurrently. Diagnostic video was captured at least 80% of the time during the recording.   PATIENT EVENTS: There was 1 patient event noted or captured during this recording without a corresponding date or am/pm. 'Think she had a seizure around 9:30 or just after. Very disoriented didn't know what day it was and why she was hooked up to machine.'   TECHNOLOGIST EVENTS: The reviewing neurodiagnostic technologist detected occasional asynchronous right and left frontotemporal spikes (Right>Left).   TIME SAMPLES: 10-minutes of every 2 hours recorded are reviewed as random time samples.   SLEEP SAMPLES: 5-minutes of every 24 hours recorded are reviewed as random sleep samples.   AWAKE: At maximal level of alertness, the posterior dominant background activity was continuous, reactive, low voltage rhythm of 11 Hz. This was symmetric, well-modulated, and attenuated with eye opening. Diffuse, symmetric, frontocentral beta range activity was present.  SLEEP:  N1 Sleep (Stage 1) was observed and characterized by the disappearance of alpha rhythm and the appearance of vertex activity.  N2 Sleep (Stage 2) was observed and characterized by vertex waves, K-complexes, and  sleep spindles.   N3 (Stage 3) sleep was observed and characterized by high amplitude Delta activity of 20%.  REM sleep was observed.   EKG: There were no arrhythmias or abnormalities noted during this recording.   Impression: This is an abnormal 72 hours ambulatory video EEG due to: 1. Bilateral independent right greater then left frontotemporal sharp and slow waves. This is consistent with areas of increase epileptogenic potential in the right and left frontotemporal regions. There was one event described as confusion with no electrographic seizure correlation but review of the EEG, indicates a few left frontotemporal epileptiform discharges prior to the event press button.        Naethan Bracewell, MD [facility]

## 2023-06-06 NOTE — Telephone Encounter (Signed)
 Report completed. Bilateral independent R>L epileptiform discharges.

## 2023-06-06 NOTE — Progress Notes (Signed)
 Sounds good. Can you please add patient to my schedule (Regular appointment). They already have an appointment with Dr. Vickey Huger in March.

## 2023-06-07 MED ORDER — LACOSAMIDE 50 MG PO TABS: 50.0000 mg | ORAL_TABLET | Freq: Two times a day (BID) | ORAL | 5 refills | Status: DC

## 2023-06-07 NOTE — Telephone Encounter (Signed)
 Dr Teresa Coombs reviewed and rec addition of VIMPAT, starting with 50 mg bid  for now. He is making an appointment for her to see him as our epilepsy specialist .

## 2023-06-07 NOTE — Telephone Encounter (Signed)
 If she is still experiencing seizures, we can add Clobazam, Cenobamate, Vimpat or consider her for VNS I have asked the pod to add her to my schedule for a second opinion visit on her.

## 2023-06-07 NOTE — Addendum Note (Signed)
 Addended by: Melvyn Novas on: 06/07/2023 04:51 PM   Modules accepted: Orders

## 2023-06-08 NOTE — Telephone Encounter (Signed)
 Patient called back in to schedule appointment for a second opinion from Dr Samara Crest - per Dr Albertina Hugger. I was able to get her scheduled for his first available March 24. She is concerned because she also has an appointment with Dr Albertina Hugger that month. 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 Is it okay to move her appointment with Dr Albertina Hugger out to a later date.   I let her know someone would reach out to confirm if that appointment wold be okay to move.

## 2023-06-08 NOTE — Telephone Encounter (Signed)
 I am having the POD look into a sooner appointment.

## 2023-06-09 NOTE — Telephone Encounter (Addendum)
Contacted patient to cancel 08/01/23 appt; have follow up appt with Dr. Vickey Huger in July

## 2023-06-10 ENCOUNTER — Ambulatory Visit (INDEPENDENT_AMBULATORY_CARE_PROVIDER_SITE_OTHER): Payer: Self-pay

## 2023-06-10 DIAGNOSIS — J309 Allergic rhinitis, unspecified: Secondary | ICD-10-CM

## 2023-06-15 ENCOUNTER — Ambulatory Visit (INDEPENDENT_AMBULATORY_CARE_PROVIDER_SITE_OTHER): Payer: Self-pay

## 2023-06-15 DIAGNOSIS — J309 Allergic rhinitis, unspecified: Secondary | ICD-10-CM

## 2023-06-24 ENCOUNTER — Ambulatory Visit (INDEPENDENT_AMBULATORY_CARE_PROVIDER_SITE_OTHER): Payer: 59

## 2023-06-24 DIAGNOSIS — J309 Allergic rhinitis, unspecified: Secondary | ICD-10-CM | POA: Diagnosis not present

## 2023-06-28 NOTE — Telephone Encounter (Signed)
 Noted

## 2023-06-29 ENCOUNTER — Ambulatory Visit (INDEPENDENT_AMBULATORY_CARE_PROVIDER_SITE_OTHER): Payer: Self-pay

## 2023-06-29 DIAGNOSIS — J309 Allergic rhinitis, unspecified: Secondary | ICD-10-CM

## 2023-07-13 ENCOUNTER — Ambulatory Visit (INDEPENDENT_AMBULATORY_CARE_PROVIDER_SITE_OTHER): Payer: Self-pay

## 2023-07-13 DIAGNOSIS — J309 Allergic rhinitis, unspecified: Secondary | ICD-10-CM | POA: Diagnosis not present

## 2023-07-20 NOTE — Telephone Encounter (Signed)
 See other email stream.  Pt was given results of EEG.

## 2023-07-27 ENCOUNTER — Ambulatory Visit (INDEPENDENT_AMBULATORY_CARE_PROVIDER_SITE_OTHER): Payer: Self-pay

## 2023-07-27 DIAGNOSIS — J309 Allergic rhinitis, unspecified: Secondary | ICD-10-CM

## 2023-08-01 ENCOUNTER — Ambulatory Visit: Payer: BC Managed Care – PPO | Admitting: Neurology

## 2023-08-05 ENCOUNTER — Ambulatory Visit (INDEPENDENT_AMBULATORY_CARE_PROVIDER_SITE_OTHER): Payer: Self-pay

## 2023-08-05 DIAGNOSIS — J309 Allergic rhinitis, unspecified: Secondary | ICD-10-CM | POA: Diagnosis not present

## 2023-08-15 ENCOUNTER — Ambulatory Visit: Payer: Self-pay | Admitting: Neurology

## 2023-08-15 ENCOUNTER — Encounter: Payer: Self-pay | Admitting: Neurology

## 2023-08-15 VITALS — BP 166/84 | HR 88 | Ht 64.0 in | Wt 273.0 lb

## 2023-08-15 DIAGNOSIS — G40009 Localization-related (focal) (partial) idiopathic epilepsy and epileptic syndromes with seizures of localized onset, not intractable, without status epilepticus: Secondary | ICD-10-CM | POA: Diagnosis not present

## 2023-08-15 DIAGNOSIS — R413 Other amnesia: Secondary | ICD-10-CM | POA: Diagnosis not present

## 2023-08-15 NOTE — Patient Instructions (Signed)
 Continue with Briviact 100 mg twice daily Continue with Lamotrigine 200 mg twice daily Continue with Vimpat 50 mg twice daily Referral for formal neuropsychological testing. Continue to follow with your doctors Please all for any seizures.  Continue to follow-up with Dr. Vickey Huger as scheduled.

## 2023-08-15 NOTE — Progress Notes (Signed)
 [facility]  PATIENT: Taylor Collier DOB: 01/03/1976  REQUESTING CLINICIAN: Juliette Alcide, MD HISTORY FROM: Patient/Husband and chart review  REASON FOR VISIT: Epilepsy    HISTORICAL  CHIEF COMPLAINT:  Chief Complaint  Patient presents with   New Patient (Initial Visit)    Pt in 13, here with husband Richard  Pt is referred by Dr Vickey Huger, for a second opinion for seizures.     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  She Tells Me That after her Seizure started in 2010 right after graduating graduate school.  Then she has few additional seizures until 2013 and was seizure-free for 11-1/2 years on lamotrigine.   In the summer 2024 she did have 2 seizures, was admitted at [facility], and EEG captured a right temporal seizure. Since discharge she had 2 additional seizures, late summer and the last 1 during winter break 2024.  In January she did have an ambulatory EEG that captured bitemporal epileptic area and Vimpat was added.  Since then no additional seizures, her last seizure was in December 2024. Her main concern today after the seizure is her memory.  She tells me that she is forgetful, her husband reports that she repeating herself all the time, misplacing items, that she is very forgetful, misplacing items, needing reminders. She is however independent all actives of daily living, she still teaches fourth grade math.  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   Handedness: Right handed  Onset: 2010  Seizure Type: Zoning out   Current frequency: Last seizure around winter break 2024  Any injuries from seizures: Denies   Seizure risk factors: Denies   Previous ASMs: Lamotrigine, Briviact and Lacosamide   Currenty ASMs:  Lamotrigine 200 mg BID, Briviact 100 mg BID and  Lacosamide 50 mg BID  ASMs side effects: Somnolence   Brain Images: Chronic microvascular Ischemic changes   Previous EEGs: Bitemporal epileptiform discharges    OTHER MEDICAL CONDITIONS: Epilepsy Hypertension, Hyperlipidemia, Obesity,   REVIEW OF SYSTEMS: Full 14 system review of systems performed and negative with exception of: As noted in the HPI   ALLERGIES: Allergies  Allergen Reactions   Amoxicillin-Pot Clavulanate Rash   Penicillins Rash    HOME MEDICATIONS: Outpatient Medications Prior to Visit  Medication Sig Dispense Refill   acetaminophen (TYLENOL) 500 MG tablet Take 1,000 mg by mouth daily.     albuterol (VENTOLIN HFA) 108 (90 Base) MCG/ACT inhaler Inhale 2 puffs into the lungs as needed for wheezing or shortness of breath.     Ascorbic Acid (VITAMIN C WITH ROSE HIPS) 500 MG tablet Take 500 mg by mouth daily.     Azelastine-Fluticasone 137-50 MCG/ACT SUSP Place 2 sprays into the nose as needed (allergies). 23 g 5   Biotin 1000 MCG tablet Take 1,000 mcg by mouth daily.     brivaracetam (BRIVIACT) 100 MG TABS tablet Take 1 tablet (100 mg total) by mouth 2 (two) times daily. 60 tablet 5   CALCIUM-VITAMIN D PO Take 1 tablet by mouth daily.     chlorthalidone (HYGROTON) 25 MG tablet TAKE ONE TABLET BY MOUTH EVERY DAY - PT. NEEDS OFFICE VISIT (Patient taking differently: Take 25 mg by mouth daily.) 7 tablet 0   diazePAM, 20 MG Dose, (VALTOCO 20 MG DOSE) 2 x 10 MG/0.1ML LQPK Place 20 mg into  the nose as needed (seizure lasting more than 2 minutes). 4 each 2   EPINEPHrine 0.3 mg/0.3 mL IJ SOAJ injection Inject one dose intramuscularly for allergic reaction. May repeat one dose if needed after 5-15 minutes. Proceed to the ER 1 each 1   labetalol (NORMODYNE) 200 MG tablet TAKE 1 TABLET BY MOUTH TWICE DAILY 4 tablet 0   lacosamide (VIMPAT) 50 MG TABS tablet Take 1 tablet (50 mg total) by mouth 2 (two) times daily. 60 tablet 5   lamoTRIgine (LAMICTAL) 200 MG tablet TAKE 1 TABLET BY  MOUTH TWICE DAILY 180 tablet 3   levocetirizine (XYZAL) 5 MG tablet Take 1 tablet (5 mg total) by mouth daily as needed for allergies. TAKE 1 TABLET BY MOUTH DAILY AS NEEDED FOR runny nose 30 tablet 5   Multiple Vitamin (MULTIVITAMIN) tablet Take 1 tablet by mouth daily.     OVER THE COUNTER MEDICATION Take 900 mg by mouth daily. Echinacea goldenseal 900 mg     rosuvastatin (CRESTOR) 10 MG tablet Take 10 mg by mouth daily.     traZODone (DESYREL) 150 MG tablet TAKE 1 TABLET BY MOUTH AT BEDTIME 90 tablet 3   UNABLE TO FIND Allergy Shot 1 per week     No facility-administered medications prior to visit.    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   Hypertension    Obesity    OSA (obstructive sleep apnea)    Seizures (HCC)    last seizure 05/2011    PAST SURGICAL HISTORY: Past Surgical History:  Procedure Laterality Date   birthmark resection     fallopian tube blockage resection     OVARIAN CYST REMOVAL      FAMILY HISTORY: Family History  Problem Relation Age of Onset   Colon cancer Mother    Aneurysm Father    Breast cancer Sister    Diabetes Paternal Aunt    Alzheimer's disease Maternal Grandmother    Uterine cancer Maternal Grandmother     SOCIAL HISTORY: Social History   Socioeconomic History   Marital status: Married    Spouse name: Not on file   Number of children: 2   Years of education: MA   Highest education level: Not on file  Occupational History   Occupation: Runner, broadcasting/film/video  Tobacco Use   Smoking status: Former    Current packs/day: 0.00    Average packs/day: 0.3 packs/day for 3.0 years (0.8 ttl pk-yrs)    Types: Cigarettes    Start date: 09/23/1994    Quit date: 09/22/1997    Years since quitting: 25.9   Smokeless tobacco:  Never   Tobacco comments:    quit 2003  Vaping Use   Vaping status: Never Used  Substance and Sexual Activity   Alcohol use: No    Comment: on occasion   Drug use: Yes    Types: Psilocybin   Sexual activity: Yes  Other Topics Concern   Not on file  Social History Narrative   Pt lives at home, married   Patient drinks 2-3 cups of caffeine daily.   Patient is right handed.   Social Drivers of Corporate investment banker Strain: Not on file  Food Insecurity: No Food Insecurity (12/06/2022)   Hunger Vital Sign  Worried About Programme researcher, broadcasting/film/video in the Last Year: Never true    Ran Out of Food in the Last Year: Never true  Transportation Needs: No Transportation Needs (12/06/2022)   PRAPARE - Administrator, Civil Service (Medical): No    Lack of Transportation (Non-Medical): No  Physical Activity: Insufficiently Active (12/01/2021)   [facility], [facility]   Exercise Vital Sign    Days of Exercise per Week: 1 day    Minutes of Exercise per Session: 30 min  Stress: No Stress Concern Present (12/01/2021)   Received from [facility], [facility] - Occupational Stress Questionnaire    Feeling of Stress : Only a little  Social Connections: Not on file  Intimate Partner Violence: Not At Risk (12/06/2022)   Humiliation, Afraid, Rape, and Kick questionnaire    Fear of Current or Ex-Partner: No    Emotionally Abused: No    Physically Abused: No    Sexually Abused: No     PHYSICAL EXAM  GENERAL EXAM/CONSTITUTIONAL: Vitals:  Vitals:   08/15/23 1400 08/15/23 1410  BP: (!) 164/82 (!) 166/84  Pulse: 86 88  Weight: 273 lb (123.8 kg)   Height: 5\' 4"  (1.626 m)    Body mass index is 46.86 kg/m. Wt Readings from Last 3 Encounters:  08/15/23 273 lb (123.8 kg)  04/28/23 265 lb (120.2 kg)  12/13/22 275 lb 12.8 oz (125.1 kg)   Patient is in no distress; well developed, nourished and groomed; neck  is supple, tearful   MUSCULOSKELETAL: Gait, strength, tone, movements noted in Neurologic exam below  NEUROLOGIC: MENTAL STATUS:      No data to display         awake, alert, oriented to person, place and time recent and remote memory intact normal attention and concentration language fluent, comprehension intact, naming intact fund of knowledge appropriate  CRANIAL NERVE:  2nd, 3rd, 4th, 6th - Visual fields full to confrontation, extraocular muscles intact, no nystagmus 5th - facial sensation symmetric 7th - facial strength symmetric 8th - hearing intact 9th - palate elevates symmetrically, uvula midline 11th - shoulder shrug symmetric 12th - tongue protrusion midline  MOTOR:  normal bulk and tone, full strength in the BUE, BLE  SENSORY:  normal and symmetric to light touch  COORDINATION:  finger-nose-finger, fine finger movements normal  GAIT/STATION:  normal     DIAGNOSTIC DATA (LABS, IMAGING, TESTING) - I reviewed patient records, labs, notes, testing and imaging myself where available.  Lab Results  Component Value Date   WBC 8.6 12/06/2022   HGB 12.4 12/06/2022   HCT 36.9 12/06/2022   MCV 87.9 12/06/2022   PLT 302 12/06/2022      Component Value Date/Time   NA 139 12/06/2022 1110   NA 137 12/25/2020 1005   K 3.6 12/06/2022 1110   CL 104 12/06/2022 1110   CO2 25 12/06/2022 1110   GLUCOSE 121 (H) 12/06/2022 1110   BUN 5 (L) 12/06/2022 1110   BUN 14 12/25/2020 1005   CREATININE 0.75 12/06/2022 1110   CALCIUM 9.4 12/06/2022 1110   PROT 6.4 (L) 12/06/2022 1110   PROT 7.1 12/25/2020 1005   ALBUMIN 3.6 12/06/2022 1110   ALBUMIN 4.6 12/25/2020 1005   AST 31 12/06/2022 1110   ALT 37 12/06/2022 1110   ALKPHOS 53 12/06/2022 1110   BILITOT 0.6 12/06/2022 1110   BILITOT 0.5 12/25/2020 1005   GFRNONAA >  60 12/06/2022 1110   GFRAA 111 12/26/2019 1024   No results found for: "CHOL", "HDL", "LDLCALC", "LDLDIRECT", "TRIG" No results found for:  "HGBA1C" No results found for: "VITAMINB12" No results found for: "TSH"  MRI Brain 11/06/2022 This MRI of the brain with and without contrast shows the following: Scattered T2/FLAIR hyperintense foci in the subcortical and deep white matter of the cerebral hemispheres.  There are nonspecific, these most likely represent sequela of moderate for age chronic microvascular ischemic change or migraine.  The appearance is not typical for demyelination.  None of the foci enhance or appear to be acute. Left mastoid effusion.   No acute findings.  Normal enhancement pattern   Ambulatory EEG 06/06/2023 This is an abnormal 72 hours ambulatory video EEG due to: Bilateral independent right greater then left frontotemporal sharp and slow waves. This is consistent with areas of increase epileptogenic potential in the right and left frontotemporal regions. There was one event described as confusion with no electrographic seizure correlation but review of the EEG, indicates a few left frontotemporal epileptiform discharges prior to the event press button.     ASSESSMENT AND PLAN  48 y.o. year old female  with history for 21-year-old woman past medical history epilepsy, hypertension, hyperlipidemia, obesity who is presenting for second opinion for her seizure.  She tells me in brief that her seizures started in 2010, described as focal unaware seizures.  She went 11 and half years seizure-free and they return in 2024.  Since then she had a total of 3 seizures, her last seizure was in during winter break 2024.  She is currently on Briviact, lamotrigine and lacosamide.  Plan for patient is to continue current medication, if there is additional breakthrough seizure, will likely recommend increasing her lacosamide and we can further increase the lamotrigine.  At this time she will continue to follow with Dr. Vickey Huger on a yearly basis, and we will get a formal neuropsychological evaluation due to her complaint of memory  loss.  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   1. Partial idiopathic epilepsy with seizures of localized onset, not intractable, without status epilepticus (HCC)   2. Memory loss     Patient Instructions  Continue with Briviact 100 mg twice daily Continue with Lamotrigine 200 mg twice daily Continue with Vimpat 50 mg twice daily Referral for formal neuropsychological testing. Continue to follow with your doctors Please all for any seizures.  Continue to follow-up with Dr. Vickey Huger as scheduled.   Per [facility] statutes, patients with seizures are not allowed to drive until they have been seizure-free for six months.  Other recommendations include using caution when using heavy equipment or power tools. Avoid working on ladders or at heights. Take showers instead of baths.  Do not swim alone.  Ensure the water temperature is not too high on the home water heater. Do not go swimming alone. Do not lock yourself in a room alone (i.e. bathroom). When caring for infants or small children, sit down when holding, feeding, or changing them to minimize risk of injury to the child in the event you have a seizure. Maintain good sleep hygiene. Avoid alcohol.  Also recommend adequate sleep, hydration, good diet and minimize stress.   During the Seizure  - First, ensure adequate ventilation and place patients on the floor on their left side  Loosen clothing around the neck and ensure the airway is patent. If the patient  is clenching the teeth, do not force the mouth open with any object as this can cause severe damage - Remove all items from the surrounding that can be hazardous. The patient may be oblivious to what's happening and may not even know what he or she is doing. If the patient is confused and wandering, either gently guide him/her away and block access to outside areas - Reassure the individual and be comforting - Call 911. In most  cases, the seizure ends before EMS arrives. However, there are cases when seizures may last over 3 to 5 minutes. Or the individual may have developed breathing difficulties or severe injuries. If a pregnant patient or a person with diabetes develops a seizure, it is prudent to call an ambulance. - Finally, if the patient does not regain full consciousness, then call EMS. Most patients will remain confused for about 45 to 90 minutes after a seizure, so you must use judgment in calling for help. - Avoid restraints but make sure the patient is in a bed with padded side rails - Place the individual in a lateral position with the neck slightly flexed; this will help the saliva drain from the mouth and prevent the tongue from falling backward - Remove all nearby furniture and other hazards from the area - Provide verbal assurance as the individual is regaining consciousness - Provide the patient with privacy if possible - Call for help and start treatment as ordered by the caregiver   After the Seizure (Postictal Stage)  After a seizure, most patients experience confusion, fatigue, muscle pain and/or a headache. Thus, one should permit the individual to sleep. For the next few days, reassurance is essential. Being calm and helping reorient the person is also of importance.  Most seizures are painless and end spontaneously. Seizures are not harmful to others but can lead to complications such as stress on the lungs, brain and the heart. Individuals with prior lung problems may develop labored breathing and respiratory distress.    Discussed Patients with epilepsy have a small risk of sudden unexpected death, a condition referred to as sudden unexpected death in epilepsy (SUDEP). SUDEP is defined specifically as the sudden, unexpected, witnessed or unwitnessed, nontraumatic and nondrowning death in patients with epilepsy with or without evidence for a seizure, and excluding documented status epilepticus, in  which post mortem examination does not reveal a structural or toxicologic cause for death     Orders Placed This Encounter  Procedures   Ambulatory referral to Neuropsychology    No orders of the defined types were placed in this encounter.   No follow-ups on file.    Windell Norfolk, MD 08/15/2023, 2:55 PM  [facility] [address], [address] [place], [place] [address] [phone]

## 2023-08-16 ENCOUNTER — Telehealth: Payer: Self-pay | Admitting: Neurology

## 2023-08-16 NOTE — Telephone Encounter (Signed)
 Referral for neuropsychology fax to [facility]. [phone], Fax: [phone]

## 2023-08-19 ENCOUNTER — Ambulatory Visit (INDEPENDENT_AMBULATORY_CARE_PROVIDER_SITE_OTHER): Payer: Self-pay

## 2023-08-19 DIAGNOSIS — J309 Allergic rhinitis, unspecified: Secondary | ICD-10-CM | POA: Diagnosis not present

## 2023-08-24 ENCOUNTER — Ambulatory Visit (INDEPENDENT_AMBULATORY_CARE_PROVIDER_SITE_OTHER): Payer: Self-pay

## 2023-08-24 DIAGNOSIS — J309 Allergic rhinitis, unspecified: Secondary | ICD-10-CM

## 2023-09-02 ENCOUNTER — Ambulatory Visit (INDEPENDENT_AMBULATORY_CARE_PROVIDER_SITE_OTHER)

## 2023-09-02 DIAGNOSIS — J309 Allergic rhinitis, unspecified: Secondary | ICD-10-CM | POA: Diagnosis not present

## 2023-09-11 ENCOUNTER — Other Ambulatory Visit: Payer: Self-pay | Admitting: Family Medicine

## 2023-09-21 ENCOUNTER — Ambulatory Visit (INDEPENDENT_AMBULATORY_CARE_PROVIDER_SITE_OTHER)

## 2023-09-21 DIAGNOSIS — J309 Allergic rhinitis, unspecified: Secondary | ICD-10-CM | POA: Diagnosis not present

## 2023-10-05 ENCOUNTER — Ambulatory Visit (INDEPENDENT_AMBULATORY_CARE_PROVIDER_SITE_OTHER)

## 2023-10-05 DIAGNOSIS — J309 Allergic rhinitis, unspecified: Secondary | ICD-10-CM

## 2023-10-09 ENCOUNTER — Other Ambulatory Visit: Payer: Self-pay | Admitting: Neurology

## 2023-10-19 ENCOUNTER — Ambulatory Visit (INDEPENDENT_AMBULATORY_CARE_PROVIDER_SITE_OTHER): Payer: Self-pay

## 2023-10-19 DIAGNOSIS — J309 Allergic rhinitis, unspecified: Secondary | ICD-10-CM | POA: Diagnosis not present

## 2023-10-24 ENCOUNTER — Encounter: Payer: Self-pay | Admitting: Internal Medicine

## 2023-11-01 NOTE — Patient Instructions (Incomplete)
 Asthma Continue albuterol  2 puffs once every 4 hours if needed for cough or wheeze You may use albuterol  2 puffs 5 to 15 minutes before activity to decrease cough or wheeze For asthma flare, begin Airsupra 2 puffs as needed for cough or wheeze.  Do not use more than 12 puffs in a 24-hour.  Allergic rhinitis Continue allergen avoidance measures directed toward tree pollen, weed pollen, dust mite, and dog as listed below Continue allergen immunotherapy and have access to an epinephrine  autoinjector set Continue Xyzal  5 mg once a day if needed for runny nose or itch Continue Flonase  2 sprays in each nostril once a day for stuffy nose Consider saline nasal rinses as needed for nasal symptoms. Use this before any medicated nasal sprays for best result  Recurrent sinusitis Keep track of antibiotic use, steroid use, and infections. A lab order has been placed to help us  evaluate your immune system.  We will call you when the results become available.  Call the clinic if this treatment plan is not working well for you.  Follow up in 6 months or sooner if needed.  Reducing Pollen Exposure The American Academy of All[employer] , Ast[employer] Immun[employer] suggests the following steps to reduce your exposure to pollen during allergy  seasons. Do not hang sheets or clothing out to dry; pollen may collect on these items. Do not mow lawns or spend time around freshly cut grass; mowing stirs up pollen. Keep windows closed at night.  Keep car windows closed while driving. Minimize morning activities outdoors, a time when pollen counts are usually at their highest. Stay indoors as much as possible when pollen counts or humidity is high and on windy days when pollen tends to remain in the air longer. Use air conditioning when possible.  Many air conditioners have filters that trap the pollen spores. Use a HEPA room air filter to remove pollen form the indoor air you breathe.   Control of Dust Mite Allergen Dust  mites play a major role in allergic asthma and rhinitis. They occur in environments with high humidity wherever human skin is found. Dust mites absorb humidity from the atmosphere (ie, they do not drink) and feed on organic matter (including shed human and animal skin). Dust mites are a microscopic type of insect that you cannot see with the naked eye. High levels of dust mites have been detected from mattresses, pillows, carpets, upholstered furniture, bed covers, clothes, soft toys and any woven material. The principal allergen of the dust mite is found in its feces. A gram of dust may contain 1,000 mites and 250,000 fecal particles. Mite antigen is easily measured in the air during house cleaning activities. Dust mites do not bite and do not cause harm to humans, other than by triggering allergies/asthma.  Ways to decrease your exposure to dust mites in your home:  1. Encase mattresses, box springs and pillows with a mite-impermeable barrier or cover  2. Wash sheets, blankets and drapes weekly in hot water (130 F) with detergent and dry them in a dryer on the hot setting.  3. Have the room cleaned frequently with a vacuum cleaner and a damp dust-mop. For carpeting or rugs, vacuuming with a vacuum cleaner equipped with a high-efficiency particulate air (HEPA) filter. The dust mite allergic individual should not be in a room which is being cleaned and should wait 1 hour after cleaning before going into the room.  4. Do not sleep on upholstered furniture (eg, couches).  5. If possible  removing carpeting, upholstered furniture and drapery from the home is ideal. Horizontal blinds should be eliminated in the rooms where the person spends the most time (bedroom, study, television room). Washable vinyl, roller-type shades are optimal.  6. Remove all non-washable stuffed toys from the bedroom. Wash stuffed toys weekly like sheets and blankets above.  7. Reduce indoor humidity to less than 50%.  Inexpensive humidity monitors can be purchased at most hardware stores. Do not use a humidifier as can make the problem worse and are not recommended.  Control of Dog or Cat Allergen Avoidance is the best way to manage a dog or cat allergy . If you have a dog or cat and are allergic to dog or cats, consider removing the dog or cat from the home. If you have a dog or cat but don't want to find it a new home, or if your family wants a pet even though someone in the household is allergic, here are some strategies that may help keep symptoms at bay:  Keep the pet out of your bedroom and restrict it to only a few rooms. Be advised that keeping the dog or cat in only one room will not limit the allergens to that room. Don't pet, hug or kiss the dog or cat; if you do, wash your hands with soap and water. High-efficiency particulate air (HEPA) cleaners run continuously in a bedroom or living room can reduce allergen levels over time. Regular use of a high-efficiency vacuum cleaner or a central vacuum can reduce allergen levels. Giving your dog or cat a bath at least once a week can reduce airborne allergen.

## 2023-11-01 NOTE — Progress Notes (Signed)
   [address] [place] [address] Dept: [phone]  FOLLOW UP NOTE  Patient ID: Taylor Collier, female    DOB: May 18, 1976  Age: 48 y.o. MRN: [id number] Date of Office Visit: 11/02/2023  Assessment  Chief Complaint: No chief complaint on file.  HPI Taylor Collier is a 48 year old female who presents to the clinic for follow-up visit.  She was last seen in this clinic on 03/24/2019 for via televisit by Marinus Sic, FNP, for evaluation of asthma, allergic rhinitis on allergen immunotherapy, and recurrent infection.  She began allergen immunotherapy directed toward weed pollen, tree pollen, dust mite, and dog on 01/17/2018.  Discussed the use of AI scribe software for clinical note transcription with the patient, who gave verbal consent to proceed.  History of Present Illness      Drug Allergies:  Allergies  Allergen Reactions   Amoxicillin-Pot Clavulanate Rash   Penicillins Rash    Physical Exam: There were no vitals taken for this visit.   Physical Exam  Diagnostics:    Assessment and Plan: No diagnosis found.  No orders of the defined types were placed in this encounter.   There are no Patient Instructions on file for this visit.  No follow-ups on file.    Thank you for the opportunity to care for this patient.  Please do not hesitate to contact me with questions.  Marinus Sic, FNP [facility]

## 2023-11-02 ENCOUNTER — Encounter: Payer: Self-pay | Admitting: Family Medicine

## 2023-11-02 ENCOUNTER — Ambulatory Visit: Payer: 59 | Admitting: Family Medicine

## 2023-11-02 VITALS — BP 142/92 | HR 81 | Temp 98.2°F | Resp 18 | Ht 63.78 in | Wt 257.5 lb

## 2023-11-02 DIAGNOSIS — J4520 Mild intermittent asthma, uncomplicated: Secondary | ICD-10-CM

## 2023-11-02 DIAGNOSIS — J329 Chronic sinusitis, unspecified: Secondary | ICD-10-CM

## 2023-11-02 DIAGNOSIS — J302 Other seasonal allergic rhinitis: Secondary | ICD-10-CM | POA: Diagnosis not present

## 2023-11-02 DIAGNOSIS — J3089 Other allergic rhinitis: Secondary | ICD-10-CM | POA: Diagnosis not present

## 2023-11-02 MED ORDER — AIRSUPRA 90-80 MCG/ACT IN AERO: 2.0000 | INHALATION_SPRAY | RESPIRATORY_TRACT | 1 refills | Status: DC | PRN

## 2023-11-02 MED ORDER — LEVOCETIRIZINE DIHYDROCHLORIDE 5 MG PO TABS: 5.0000 mg | ORAL_TABLET | Freq: Every day | ORAL | 5 refills | Status: AC | PRN

## 2023-11-02 MED ORDER — FLUTICASONE PROPIONATE 50 MCG/ACT NA SUSP: 2.0000 | Freq: Every morning | NASAL | 1 refills | Status: AC

## 2023-11-09 ENCOUNTER — Other Ambulatory Visit: Payer: Self-pay | Admitting: Neurology

## 2023-11-15 DIAGNOSIS — J3089 Other allergic rhinitis: Secondary | ICD-10-CM | POA: Diagnosis not present

## 2023-11-15 NOTE — Progress Notes (Signed)
 VIAL MADE 11-15-23

## 2023-11-16 ENCOUNTER — Ambulatory Visit (INDEPENDENT_AMBULATORY_CARE_PROVIDER_SITE_OTHER): Payer: Self-pay

## 2023-11-16 DIAGNOSIS — J309 Allergic rhinitis, unspecified: Secondary | ICD-10-CM

## 2023-11-30 ENCOUNTER — Ambulatory Visit (INDEPENDENT_AMBULATORY_CARE_PROVIDER_SITE_OTHER): Payer: Self-pay

## 2023-11-30 DIAGNOSIS — J309 Allergic rhinitis, unspecified: Secondary | ICD-10-CM | POA: Diagnosis not present

## 2023-12-01 ENCOUNTER — Telehealth: Payer: Self-pay | Admitting: Neurology

## 2023-12-01 MED ORDER — LACOSAMIDE 50 MG PO TABS: 50.0000 mg | ORAL_TABLET | Freq: Two times a day (BID) | ORAL | 5 refills | Status: DC

## 2023-12-01 NOTE — Telephone Encounter (Signed)
 Please resend to [facility] due to being out of stock   Requested Prescriptions   Pending Prescriptions Disp Refills   lacosamide  (VIMPAT ) 50 MG TABS tablet 60 tablet 5    Sig: Take 1 tablet (50 mg total) by mouth 2 (two) times daily.   Last seen 08/15/23, next appt 12/12/23  Dispenses   Dispensed Days Supply Quantity Provider Pharmacy  lacosamide  50 mg tablet 10/20/2023 30 60 each Dohmeier, Dedra, MD [facility] [place] ...  lacosamide  50 mg tablet 09/19/2023 30 60 each Dohmeier, Dedra, MD [facility] [place], ...  lacosamide  50 mg tablet 08/22/2023 30 60 each Dohmeier, Dedra, MD [facility] [place], ...  lacosamide  50 mg tablet 07/28/2023 30 60 each Dohmeier, Dedra, MD [facility] [place], ...  lacosamide  50 mg tablet 07/01/2023 30 60 each Dohmeier, Dedra, MD [facility] [place], ...  lacosamide  50 mg tablet 06/07/2023 30 60 each Dohmeier, Dedra, MD [facility] [place], .[place]

## 2023-12-01 NOTE — Telephone Encounter (Signed)
 Chiquita from [facility] called stating that they do not have the pt's lacosamide  (VIMPAT ) 50 MG TABS tablet in stock but [facility] in [place] does so they would like to know if the pt's Rx can be faxed to them instead. Please advise.

## 2023-12-02 NOTE — Telephone Encounter (Signed)
 refilled

## 2023-12-07 ENCOUNTER — Ambulatory Visit (INDEPENDENT_AMBULATORY_CARE_PROVIDER_SITE_OTHER): Payer: Self-pay

## 2023-12-07 DIAGNOSIS — J309 Allergic rhinitis, unspecified: Secondary | ICD-10-CM | POA: Diagnosis not present

## 2023-12-12 ENCOUNTER — Ambulatory Visit: Payer: BC Managed Care – PPO | Admitting: Neurology

## 2023-12-16 ENCOUNTER — Ambulatory Visit (INDEPENDENT_AMBULATORY_CARE_PROVIDER_SITE_OTHER): Payer: Self-pay

## 2023-12-16 DIAGNOSIS — J309 Allergic rhinitis, unspecified: Secondary | ICD-10-CM

## 2023-12-21 ENCOUNTER — Other Ambulatory Visit: Payer: Self-pay | Admitting: Family Medicine

## 2024-01-10 ENCOUNTER — Other Ambulatory Visit: Payer: Self-pay | Admitting: Neurology

## 2024-01-13 ENCOUNTER — Encounter: Payer: Self-pay | Admitting: Neurology

## 2024-01-14 ENCOUNTER — Encounter: Payer: Self-pay | Admitting: Family Medicine

## 2024-01-14 DIAGNOSIS — J302 Other seasonal allergic rhinitis: Secondary | ICD-10-CM

## 2024-01-16 ENCOUNTER — Other Ambulatory Visit: Payer: Self-pay | Admitting: Neurology

## 2024-01-18 ENCOUNTER — Ambulatory Visit (INDEPENDENT_AMBULATORY_CARE_PROVIDER_SITE_OTHER): Payer: Self-pay

## 2024-01-18 DIAGNOSIS — J302 Other seasonal allergic rhinitis: Secondary | ICD-10-CM | POA: Diagnosis not present

## 2024-01-18 DIAGNOSIS — J3089 Other allergic rhinitis: Secondary | ICD-10-CM | POA: Diagnosis not present

## 2024-01-18 NOTE — Telephone Encounter (Signed)
 Requested Prescriptions   Pending Prescriptions Disp Refills   lacosamide  (VIMPAT ) 50 MG TABS tablet [Pharmacy Med Name: lacosamide  50 mg tablet] 60 tablet 0    Sig: TAKE 1 TABLET BY MOUTH TWICE DAILY   Last seen 08/15/23 Next appt 03/15/24 Dispenses   Dispensed Days Supply Quantity Provider Pharmacy  lacosamide  50 mg tablet 12/25/2023 30 60 each Camara, [name], MD [facility] [place], ...  LACOSAMIDE    TAB 50MG  12/01/2023 30 60 tablet Dohmeier, Dedra, MD [facility]  lacosamide  50 mg tablet 10/20/2023 30 60 each Dohmeier, Dedra, MD [facility] [place], ...  lacosamide  50 mg tablet 09/19/2023 30 60 each Dohmeier, Dedra, MD [facility] [place], ...  lacosamide  50 mg tablet 08/22/2023 30 60 each Dohmeier, Dedra, MD [facility] [place], ...  lacosamide  50 mg tablet 07/28/2023 30 60 each Dohmeier, Dedra, MD [facility] [place], ...  lacosamide  50 mg tablet 07/01/2023 30 60 each Dohmeier, Dedra, MD [facility] [place], ...  lacosamide  50 mg tablet 06/07/2023 30 60 each Dohmeier, Dedra, MD [facility] [place], [place]

## 2024-01-18 NOTE — Progress Notes (Signed)
VIAL NOT MADE UNTIL NEEDED.

## 2024-01-18 NOTE — Progress Notes (Signed)
 Aeroallergen Immunotherapy (**NOTE NEW SCRIPT**)  Ordering Provider: Dr. Marty Shaggy  Patient Details Name: GUISELLE MIAN MRN: [id number] Date of Birth: 11/06/1975  Order 1 of 1  Vial Label: W/T/DM/D  0.2 ml (Volume)  1:20 Concentration -- Cocklebur 0.2 ml (Volume)  1:20 Concentration -- Burweed Marshelder 0.8 ml (Volume)  1:20 Concentration -- Weed Mix* 0.8 ml (Volume)  1:20 Concentration -- Eastern 10 Tree Mix (also Sweet Gum) 0.2 ml (Volume)  1:20 Concentration -- Ash mix* 1.0 ml (Volume)  1:10 Concentration -- Dog Epithelia 1.0 ml (Volume)   AU Concentration -- Mite Mix (DF 5,000 & DP 5,000)   4.2  ml Extract Subtotal 0.8  ml Diluent  5.0  ml Maintenance Total  Schedule:  C  Red Vial (1:100): Schedule C (5 doses)  Special Instructions: After completion of the first Red Vial, please space to every two weeks. Patient prefers every two week injections. Ok to up dose new vials at 0.46mL --> 0.3 mL --> 0.5 mL.

## 2024-02-01 ENCOUNTER — Ambulatory Visit (INDEPENDENT_AMBULATORY_CARE_PROVIDER_SITE_OTHER): Payer: Self-pay

## 2024-02-01 DIAGNOSIS — J309 Allergic rhinitis, unspecified: Secondary | ICD-10-CM

## 2024-02-15 ENCOUNTER — Ambulatory Visit (INDEPENDENT_AMBULATORY_CARE_PROVIDER_SITE_OTHER): Payer: Self-pay

## 2024-02-15 DIAGNOSIS — J309 Allergic rhinitis, unspecified: Secondary | ICD-10-CM | POA: Diagnosis not present

## 2024-02-29 ENCOUNTER — Ambulatory Visit (INDEPENDENT_AMBULATORY_CARE_PROVIDER_SITE_OTHER): Payer: Self-pay

## 2024-02-29 DIAGNOSIS — J309 Allergic rhinitis, unspecified: Secondary | ICD-10-CM | POA: Diagnosis not present

## 2024-03-15 ENCOUNTER — Encounter: Payer: Self-pay | Admitting: Neurology

## 2024-03-15 ENCOUNTER — Ambulatory Visit: Admitting: Neurology

## 2024-03-15 VITALS — BP 144/80 | HR 80 | Ht 64.0 in | Wt 253.6 lb

## 2024-03-15 DIAGNOSIS — G40A11 Absence epileptic syndrome, intractable, with status epilepticus: Secondary | ICD-10-CM | POA: Diagnosis not present

## 2024-03-15 DIAGNOSIS — R413 Other amnesia: Secondary | ICD-10-CM | POA: Diagnosis not present

## 2024-03-15 DIAGNOSIS — Z5181 Encounter for therapeutic drug level monitoring: Secondary | ICD-10-CM | POA: Diagnosis not present

## 2024-03-15 DIAGNOSIS — G40309 Generalized idiopathic epilepsy and epileptic syndromes, not intractable, without status epilepticus: Secondary | ICD-10-CM | POA: Diagnosis not present

## 2024-03-15 MED ORDER — BRIVARACETAM 100 MG PO TABS: 100.0000 mg | ORAL_TABLET | Freq: Two times a day (BID) | ORAL | 3 refills | Status: AC

## 2024-03-15 MED ORDER — LACOSAMIDE 50 MG PO TABS: 50.0000 mg | ORAL_TABLET | Freq: Two times a day (BID) | ORAL | 3 refills | Status: AC

## 2024-03-15 MED ORDER — LAMOTRIGINE 200 MG PO TABS: 200.0000 mg | ORAL_TABLET | Freq: Two times a day (BID) | ORAL | 3 refills | Status: AC

## 2024-03-15 MED ORDER — MIDAZOLAM 5 MG/0.1ML NA SOLN: 5.0000 mg | NASAL | 2 refills | Status: AC | PRN

## 2024-03-15 NOTE — Patient Instructions (Addendum)
 Patient  will be out of work until ambulatory EEG is completed, Taylor Gores, MD    48 y.o. 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            I would like to thank Burdine, Elspeth BRAVO, MD for allowing me to meet with this pleasant patient.    Continue with Lamotrigine  200 mg twice daily Continue with Vimpat  50 mg twice daily Referral for formal neuropsychological testing. Continue to follow with your doctors Please all for any seizures.  Continue to follow-up with me, Dr Collier in January - February 2026.

## 2024-03-15 NOTE — Progress Notes (Addendum)
 Provider:  Dedra Gores, MD  Primary Care Physician:  Lari Elspeth BRAVO, MD [address] [place] [address]     Referring Provider: Lari Elspeth BRAVO, Md [address] [place],  [place] [address]          Chief Complaint according to patient   Patient presents with:                HISTORY OF PRESENT ILLNESS:  Taylor Collier is a 48 y.o. female patient who is here for revisit 03/15/2024 for  severe mood changes on anti seizure medication; Lacosamide ; before that Keppra  affected her ,changed to [place] since December with Lacosamide . .    Could she better on another medication? She had  reportedly one possible absence seizure in May, a spell which she denies.Husband reports that there may have been some absence seizures she is just not aware off.   She feels emotionally unstable and impaired in multitasking, frequently misplacing items, etc.    5 points on depression score out of 15 points     Chief concern according to patient :    Patient is here for as a seizure patient now with headaches and mood changes - she had a possible seizure in May - was starring off and another teacher was not able to get her attention.  Last headache was 3-4 days ago .  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   Then she has few additional seizures until 2013 and was seizure-free for 11-1/2 years on lamotrigine  alone.   In  the summer 2024 she did have 2 seizures, was admitted at [facility], and EEG captured a right temporal seizure.    Since discharge,  she had 2 additional seizures, late summer and the last 1 during winter break 2024.   In January she did have an ambulatory EEG that captured bitemporal epileptic area and Vimpat  was added.  Since then no additional seizures, her last seizure was in December 2024. Her main concern today after the seizure is her memory.  She tells me that she is forgetful, her husband reports that she repeating herself all the time, misplacing items, that she is very forgetful, misplacing items, needing reminders. She is however independent all actives of daily living, she still teaches fourth grade math.   She is worried about her ability to teach effectively and also was embarrassed by a seizure that took place in the classroom,   Here due to a seizure in May and mood and memory concerns.     Handedness: Right handed   Onset: 2010   Seizure Type: Zoning out    Current frequency: Last seizure around winter break 2024   Any injuries from seizures: Denies    Seizure risk factors: Denies    Previous ASMs: Lamotrigine ,  Briviact  and Lacosamide     Currenty ASMs:  Lamotrigine  200 mg BID, Briviact  100 mg BID and Lacosamide  50 mg BID   ASMs side effects: Somnolence    Brain Images: Chronic microvascular Ischemic changes    Previous EEGs: Bitemporal epileptiform discharges      OTHER MEDICAL CONDITIONS: Epilepsy Hypertension, Hyperlipidemia, Obesity,    REVIEW OF SYSTEMS: Full 14 system review of systems performed and negative with exception of: As noted in the HPI     Review of Systems: Out of a complete 14 system review, the patient complains of only the following symptoms, and all other reviewed systems are negative.:   S    Social History   Socioeconomic History   Marital status: Married    Spouse name: Not on file   Number of children: 2   Years of education: MA    Highest education level: Not on file  Occupational History   Occupation: runner, broadcasting/film/video  Tobacco Use   Smoking status: Former    Current packs/day: 0.00    Average packs/day: 0.3 packs/day for 3.0 years (0.8 ttl pk-yrs)    Types: Cigarettes    Start date: 09/23/1994    Quit date: 09/22/1997    Years since quitting: 26.4   Smokeless tobacco: Never   Tobacco comments:    quit 2003  Vaping Use   Vaping status: Never Used  Substance and Sexual Activity   Alcohol use: No    Comment: on occasion   Drug use: Yes    Types: Psilocybin   Sexual activity: Yes  Other Topics Concern   Not on file  Social History Narrative   Pt lives at home, married   Patient drinks 2-3 cups of caffeine daily.   Patient is right handed.   Social Drivers of Corporate Investment Banker Strain: Not on file  Food Insecurity: No Food Insecurity (12/06/2022)   Hunger Vital Sign    Worried About Running Out of Food in the Last Year: Never true    Ran Out of Food in the Last Year: Never true  Transportation Needs: No Transportation Needs (12/06/2022)   PRAPARE - Administrator, Civil Service (Medical): No    Lack of Transportation (Non-Medical): No  Physical Activity: Insufficiently Active (12/01/2021)   [facility]   Exercise Vital Sign    On average, how many days per week do you engage in moderate to strenuous exercise (like a brisk walk)?: 1 day    On average, how many minutes do you engage in exercise at this level?: 30 min  Stress: No Stress Concern Present (12/01/2021)   Received from [facility] - Occupational Stress Questionnaire    Feeling of Stress : Only a little  Social Connections: Not on file    Family History  Problem Relation Age of Onset   Migraines Mother    Colon cancer Mother    Aneurysm Father    Breast cancer Sister    Alzheimer's disease Maternal Grandmother    Uterine cancer Maternal Grandmother    Diabetes  Paternal Aunt    Seizures Neg Hx    Stroke Neg Hx     Past Medical History:  Diagnosis Date   Anxiety    Apnea    witnessed with snoring   Chronic insomnia 12/24/2014   Common migraine with intractable migraine 12/26/2019   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     AIRSUPRA  90-80 MCG/ACT AERO INHALE TWO PUFFS INTO THE LUNGS EVERY 4 HOURS AS NEEDED 10.7 g 1   albuterol  (VENTOLIN  HFA) 108 (90 Base) MCG/ACT inhaler Inhale 2 puffs into the lungs as needed for wheezing or shortness of breath.     Ascorbic Acid (VITAMIN C WITH ROSE HIPS) 500 MG tablet Take 500 mg by mouth daily.     Azelastine -Fluticasone  137-50 MCG/ACT SUSP Place 2 sprays into the nose as needed (allergies). 23 g 5   Biotin 1000 MCG tablet Take 1,000 mcg by mouth daily.     BRIVIACT  100 MG TABS tablet TAKE 1 TABLET BY MOUTH TWICE DAILY 60 tablet 5   CALCIUM -VITAMIN D PO Take 1 tablet by mouth daily.     chlorthalidone  (HYGROTON ) 25 MG tablet TAKE ONE TABLET BY MOUTH EVERY DAY - PT. NEEDS OFFICE VISIT (Patient taking differently: Take 25 mg by mouth daily.) 7 tablet 0   diazePAM , 20 MG Dose, (VALTOCO  20 MG DOSE) 2 x 10 MG/0.1ML LQPK Place 20 mg into the nose as needed (seizure lasting more than 2 minutes). 4 each 2   EPINEPHrine  0.3 mg/0.3 mL IJ SOAJ injection Inject one dose intramuscularly for allergic reaction. May repeat one dose if needed after 5-15 minutes. Proceed to the ER 1 each 1   fluticasone  (FLONASE ) 50 MCG/ACT nasal spray Place 2 sprays into both nostrils every  morning. 48 g 1   labetalol  (NORMODYNE ) 200 MG tablet TAKE 1 TABLET BY MOUTH TWICE DAILY 4 tablet 0   lacosamide  (VIMPAT ) 50 MG TABS tablet TAKE 1 TABLET BY MOUTH TWICE DAILY 180 tablet 3   lamoTRIgine  (LAMICTAL ) 200 MG tablet TAKE 1 TABLET BY MOUTH TWICE DAILY 180 tablet 3   levocetirizine (XYZAL ) 5 MG tablet Take 1 tablet (5 mg total) by mouth daily as needed for allergies (Can take an extra dose during flare ups.). 30 tablet 5   Midazolam  5 MG/0.1ML SOLN Give 1 spray (5mg ) into 1 nostril. If no response in 10 min, may give second spray in other nostril. Do not give second dose if patient has trouble breathing or excessive sedation. Max 2 doses/seizure episode, 1 episode every 3 days or 5 episodes/month.     Multiple Vitamin (MULTIVITAMIN) tablet Take 1 tablet by mouth daily.     OVER THE COUNTER MEDICATION Take 900 mg by mouth daily. Echinacea goldenseal 900 mg     rosuvastatin  (CRESTOR ) 10 MG tablet Take 10 mg by mouth daily.     traZODone  (DESYREL ) 150 MG tablet TAKE 1 TABLET BY MOUTH AT BEDTIME 90 tablet 3   UNABLE TO FIND Allergy  Shot 1 per week     No current facility-administered medications on file prior to visit.    Allergies  Allergen Reactions   Amoxicillin-Pot Clavulanate Rash   Penicillins Rash     DIAGNOSTIC DATA (LABS, IMAGING, TESTING) - I reviewed patient records, labs, notes, testing and imaging myself where available.  Lab Results  Component Value Date   WBC 8.6 12/06/2022  HGB 12.4 12/06/2022   HCT 36.9 12/06/2022   MCV 87.9 12/06/2022   PLT 302 12/06/2022      Component Value Date/Time   NA 139 12/06/2022 1110   NA 137 12/25/2020 1005   K 3.6 12/06/2022 1110   CL 104 12/06/2022 1110   CO2 25 12/06/2022 1110   GLUCOSE 121 (H) 12/06/2022 1110   BUN 5 (L) 12/06/2022 1110   BUN 14 12/25/2020 1005   CREATININE 0.75 12/06/2022 1110   CALCIUM  9.4 12/06/2022 1110   PROT 6.4 (L) 12/06/2022 1110   PROT 7.1 12/25/2020 1005   ALBUMIN 3.6 12/06/2022 1110    ALBUMIN 4.6 12/25/2020 1005   AST 31 12/06/2022 1110   ALT 37 12/06/2022 1110   ALKPHOS 53 12/06/2022 1110   BILITOT 0.6 12/06/2022 1110   BILITOT 0.5 12/25/2020 1005   GFRNONAA >60 12/06/2022 1110   GFRAA 111 12/26/2019 1024   No results found for: CHOL, HDL, LDLCALC, LDLDIRECT, TRIG, CHOLHDL No results found for: YHAJ8R No results found for: VITAMINB12 No results found for: TSH  PHYSICAL EXAM:  Vitals:   03/15/24 1440  BP: (!) 144/80  Pulse: 80  SpO2: 97%   No data found. Body mass index is 43.53 kg/m.   Wt Readings from Last 3 Encounters:  03/15/24 253 lb 9.6 oz (115 kg)  11/02/23 257 lb 8 oz (116.8 kg)  08/15/23 273 lb (123.8 kg)     Ht Readings from Last 3 Encounters:  03/15/24 5' 4 (1.626 m)  11/02/23 5' 3.78 (1.62 m)  08/15/23 5' 4 (1.626 m)      General: The patient is awake, alert and appears not in acute distress and groomed. Head: Normocephalic, atraumatic.  Neck is supple. Cardiovascular:  Regular rate and cardiac rhythm by pulse, without distended neck veins. Respiratory: no shortness of breath  Skin:  Without evidence of ankle edema, or rash. Trunk: BMI is 43. 5     NEUROLOGIC EXAM: The patient is awake and alert,tearful,  agitated   but fully oriented to place and time.     Memory subjective described as  impaired, amnestic blips.   Attention span & concentration ability appears impaired    Speech is fluent,  without  dysarthria, dysphonia or aphasia.    Mood and affect are appropriate.   Neurological Examination: awake, alert, oriented to person, place and time recent and remote memory intact normal attention and concentration language fluent, comprehension intact, naming intact fund of knowledge appropriate   CRANIAL NERVE:  2nd, 3rd, 4th, 6th - Visual fields full to confrontation, extraocular muscles intact, no nystagmus 5th - facial sensation symmetric 7th - facial strength symmetric 8th - hearing  intact 9th - palate elevates symmetrically, uvula midline 11th - shoulder shrug symmetric 12th - tongue protrusion midline   MOTOR:  normal bulk and tone, full strength in the BUE, BLE   SENSORY:  normal and symmetric to light touch   COORDINATION:  finger-nose-finger, fine finger movements normal   GAIT/STATION:  normal  ASSESSMENT AND PLAN :   48 y.o. year old female  here with:  Suspected Subclinical Seizures-  she reports amnestic spells , can't remember misplaced things, and is  memory impaired in the class room.   Absence type  seizures  on 3 medications. Lamotrigine  200 mg BID, Briviact  100 mg BID and Lacosamide  50 mg BID    EEG with : Bitemporal epileptiform discharges during EMU ( July 2024 ) before this regimen was started.  She is affected in her ability to  prepare and to deliver her teaching  duties, cognitive impairment affects daily life,    I ordered CMET CBC and refilled al 3 medications.   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     I would like to thank Burdine, Elspeth BRAVO, MD for allowing me to meet with this pleasant patient.   Continue with Lamotrigine  200 mg twice daily Continue with Vimpat  50 mg twice daily Breviact did not improve mood issues.  Referral for formal neuropsychological testing. Continue to follow with your physicians aside from neurology.  Please call for any seizures.  Continue to follow-up with me, Dr Chalice in January - February 2026.     Per [place]  DMV statutes, patients with seizures are not allowed to drive until they have been seizure-free for six months.  Other recommendations include using caution when using heavy equipment or power tools. Avoid working on ladders or at heights. Take showers instead of baths.  Do not swim alone.  Ensure the water temperature is not too high on the home water heater. Do not go  swimming alone. Do not lock yourself in a room alone (i.e. bathroom). When caring for infants or small children, sit down when holding, feeding, or changing them to minimize risk of injury to the child in the event you have a seizure. Maintain good sleep hygiene. Avoid alcohol.  Also recommend adequate sleep, hydration, good diet and minimize stress.     During the Seizure   - First, ensure adequate ventilation and place patients on the floor on their left side  Loosen clothing around the neck and ensure the airway is patent. If the patient is clenching the teeth, do not force the mouth open with any object as this can cause severe damage - Remove all items from the surrounding that can be hazardous. The patient may be oblivious to what's happening and may not even know what he or she is doing. If the patient is confused and wandering, either gently guide him/her away and block access to outside areas - Reassure the individual and be comforting - Call 911. In most cases, the seizure ends before EMS arrives. However, there are cases when seizures may last over 3 to 5 minutes. Or the individual may have developed breathing difficulties or severe injuries. If a pregnant patient or a person with diabetes develops a seizure, it is prudent to call an ambulance. - Finally, if the patient does not regain full consciousness, then call EMS. Most patients will remain confused for about 45 to 90 minutes after a seizure, so you must use judgment in calling for help. - Avoid restraints but make sure the patient is in a bed with padded side rails - Place the individual in a lateral position with the neck slightly flexed; this will help the saliva drain from the mouth and prevent the tongue from falling backward - Remove all nearby furniture and other hazards from the area - Provide verbal assurance as the individual is regaining consciousness - Provide the patient with privacy if possible - Call for help and start  treatment as ordered by the caregiver    After the Seizure (Postictal Stage)   After a seizure, most patients experience confusion, fatigue, muscle pain and/or a headache. Thus, one should permit the individual to sleep. For the next few days, reassurance is  essential. Being calm and helping reorient the person is also of importance.   Most seizures are painless and end spontaneously. Seizures are not harmful to others but can lead to complications such as stress on the lungs, brain and the heart. Individuals with prior lung problems may develop labored breathing and respiratory distress.      The patient's condition requires frequent monitoring and adjustments in the treatment plan, reflecting the ongoing complexity of care.  This provider is the continuing focal point for all needed services for this condition.  After spending a total time of  40  minutes face to face and time for  history taking, physical and neurologic examination, review of laboratory studies,  personal review of imaging studies, reports and results of other testing and review of referral information / records as far as provided in visit,   Electronically signed by: Dedra Gores, MD 03/15/2024 3:02 PM  [facility] Board certified by The [employer] of Sleep Medicine and Diplomate of the [employer] of Sleep Medicine. Board certified In Neurology through the ABPN, Fellow of the [employer] of Neurology.

## 2024-03-16 ENCOUNTER — Ambulatory Visit (INDEPENDENT_AMBULATORY_CARE_PROVIDER_SITE_OTHER): Payer: Self-pay

## 2024-03-16 ENCOUNTER — Ambulatory Visit: Payer: Self-pay | Admitting: Neurology

## 2024-03-16 DIAGNOSIS — J309 Allergic rhinitis, unspecified: Secondary | ICD-10-CM

## 2024-03-16 LAB — CBC
Hematocrit: 41 % (ref 34.0–46.6)
Hemoglobin: 13.7 g/dL (ref 11.1–15.9)
MCH: 30.4 pg (ref 26.6–33.0)
MCHC: 33.4 g/dL (ref 31.5–35.7)
MCV: 91 fL (ref 79–97)
Platelets: 332 x10E3/uL (ref 150–450)
RBC: 4.5 x10E6/uL (ref 3.77–5.28)
RDW: 12.7 % (ref 11.7–15.4)
WBC: 4.5 x10E3/uL (ref 3.4–10.8)

## 2024-03-16 LAB — COMPREHENSIVE METABOLIC PANEL WITH GFR
ALT: 35 IU/L — ABNORMAL HIGH (ref 0–32)
AST: 28 IU/L (ref 0–40)
Albumin: 4.6 g/dL (ref 3.9–4.9)
Alkaline Phosphatase: 88 IU/L (ref 41–116)
BUN/Creatinine Ratio: 15 (ref 9–23)
BUN: 12 mg/dL (ref 6–24)
Bilirubin Total: 0.2 mg/dL (ref 0.0–1.2)
CO2: 28 mmol/L (ref 20–29)
Calcium: 9.9 mg/dL (ref 8.7–10.2)
Chloride: 97 mmol/L (ref 96–106)
Creatinine, Ser: 0.79 mg/dL (ref 0.57–1.00)
Globulin, Total: 2.6 g/dL (ref 1.5–4.5)
Glucose: 103 mg/dL — ABNORMAL HIGH (ref 70–99)
Potassium: 3.6 mmol/L (ref 3.5–5.2)
Sodium: 140 mmol/L (ref 134–144)
Total Protein: 7.2 g/dL (ref 6.0–8.5)
eGFR: 91 mL/min/1.73 (ref >59)

## 2024-03-19 ENCOUNTER — Encounter: Payer: Self-pay | Admitting: Neurology

## 2024-03-20 ENCOUNTER — Telehealth: Payer: Self-pay | Admitting: *Deleted

## 2024-03-20 NOTE — Telephone Encounter (Signed)
 I contacted [facility].  They do not have any recent order from 03-15-2024.  Order to be signed in Dr. Lionell in box (office).  They will check with authorization then call pt.  Can be early as 7-10 days, unless cancellation then could be sooner.  Sent pt [facility].

## 2024-03-20 NOTE — Telephone Encounter (Signed)
 I LMVM for [facility] to call back re: Amb EEG ordered 03/15/2024

## 2024-03-21 NOTE — Telephone Encounter (Signed)
 I faxed completed and signed copy of the Amb EEG to [facility]. I called to day and they did received the faxed order.

## 2024-03-22 DIAGNOSIS — J3089 Other allergic rhinitis: Secondary | ICD-10-CM | POA: Diagnosis not present

## 2024-03-22 DIAGNOSIS — J301 Allergic rhinitis due to pollen: Secondary | ICD-10-CM | POA: Diagnosis not present

## 2024-03-22 DIAGNOSIS — J3081 Allergic rhinitis due to animal (cat) (dog) hair and dander: Secondary | ICD-10-CM | POA: Diagnosis not present

## 2024-03-22 NOTE — Progress Notes (Signed)
 VIAL MADE ON 03/22/24

## 2024-03-30 ENCOUNTER — Encounter: Payer: Self-pay | Admitting: Family Medicine

## 2024-04-02 NOTE — Telephone Encounter (Signed)
 Resent last office note for [facility] [phone]. 11 pgs.  Received via fax confirmation.

## 2024-04-04 DIAGNOSIS — Z0289 Encounter for other administrative examinations: Secondary | ICD-10-CM

## 2024-04-05 DIAGNOSIS — Z5181 Encounter for therapeutic drug level monitoring: Secondary | ICD-10-CM | POA: Diagnosis not present

## 2024-04-05 DIAGNOSIS — G40909 Epilepsy, unspecified, not intractable, without status epilepticus: Secondary | ICD-10-CM

## 2024-04-05 DIAGNOSIS — Z0289 Encounter for other administrative examinations: Secondary | ICD-10-CM

## 2024-04-05 NOTE — Telephone Encounter (Signed)
 I called [facility].  They are to disconnect pt this morning.  Will take 24hrs to upload information. Resulted 1-2 wks.

## 2024-04-05 NOTE — Telephone Encounter (Signed)
 FMLA is done, not sure of return date.  To Dr. Chalice for review.

## 2024-04-06 ENCOUNTER — Ambulatory Visit (INDEPENDENT_AMBULATORY_CARE_PROVIDER_SITE_OTHER)

## 2024-04-06 DIAGNOSIS — J309 Allergic rhinitis, unspecified: Secondary | ICD-10-CM | POA: Diagnosis not present

## 2024-04-06 NOTE — Telephone Encounter (Signed)
 Will complete as soon as possible

## 2024-04-07 ENCOUNTER — Other Ambulatory Visit: Payer: Self-pay | Admitting: Neurology

## 2024-04-07 ENCOUNTER — Encounter (INDEPENDENT_AMBULATORY_CARE_PROVIDER_SITE_OTHER): Payer: Self-pay | Admitting: Neurology

## 2024-04-07 DIAGNOSIS — Z5181 Encounter for therapeutic drug level monitoring: Secondary | ICD-10-CM

## 2024-04-07 DIAGNOSIS — G40A11 Absence epileptic syndrome, intractable, with status epilepticus: Secondary | ICD-10-CM

## 2024-04-07 DIAGNOSIS — R413 Other amnesia: Secondary | ICD-10-CM

## 2024-04-07 DIAGNOSIS — G40309 Generalized idiopathic epilepsy and epileptic syndromes, not intractable, without status epilepticus: Secondary | ICD-10-CM

## 2024-04-07 NOTE — Procedures (Signed)
 Patient Name: Taylor Collier  MRN: [id number]  Referring Physician/Provider: Dohmeier    Study start date: 04/02/2024 at 0311 PM Study end date: 04/05/2024 at 1119 AM Duration: 68 hours   Clinical History: This is a 48 y/o W with history of epilepsy. EEG requested to rule out subclinical seizures.  INTERMITTENT MONITORING with VIDEO TECHNICAL SUMMARY: This AVEEG was performed using equipment provided by [employer] utilizing Bluetooth ( Trackit ) amplifiers with continuous EEGT attended video collection using encrypted remote transmission via [employer] secured cellular tower network with data rates for each AVEEG performed. This is a therapist, music AVEEG, obtained, according to the 10-20 international electrode placement system, reformatted digitally into referential and bipolar montages. Data was acquired with a minimum of 21 bipolar connections and sampled at a minimum rate of 250 cycles per second per channel, maximum rate of 450 cycles per second per channel and two channels for EKG. The entire VEEG study was recorded through cable and or radio telemetry for subsequent analysis. Specified epochs of the AVEEG data were identified at the direction of the subject by the depression of a push button by the patient. Each patients event file included data acquired two minutes prior to the push button activation and continuing until two minutes afterwards. AVEEG files were reviewed on [facility] server, Licensed Software provided by [employer] with a digital high frequency filter set at 70 Hz and a low frequency filter set at 1 Hz with a paper speed of 43mm/s resulting in 10 seconds per digital page. This entire AVEEG was reviewed by the EEG Technologist. Random time samples, random sleep samples, clips, patient initiated push button files with included patient daily diary logs, EEG Technologist pruned data was reviewed and verified for accuracy and validity by the governing  reading neurologist in full details. This AEEGV was fully compliant with all requirements for CPT 97500 for setup, patient education, take down and administered by an EEG technologist.  Long-Term EEG with Video was monitored intermittently by a qualified EEG technologist for the entirety of the recording; quality check-ins were performed at a minimum of every two hours, checking and documenting real-time data and video to assure the integrity and quality of the recording (e.g., camera position, electrode integrity and impedance), and identify the need for maintenance. For intermittent monitoring, an EEG Technologist monitored no more than 12 patients concurrently. Diagnostic video was captured at least 80% of the time during the recording.  PATIENT EVENTS: There were no patient events noted or captured during this recording.  TECHNOLOGIST EVENTS: Significant findings for occasional, independent bi anterior-temporal sharp waves with right temporal predominance.  TIME SAMPLES: 10-minutes of every 2 hours recorded are reviewed as random time samples.  SLEEP SAMPLES: 5-minutes of every 24 hours recorded are reviewed as random sleep samples.  AWAKE: At maximal level of alertness, the posterior dominant background activity was continuous, reactive, low voltage rhythm of 10-11 Hz. This was symmetric, well-modulated, and attenuated with eye opening. Diffuse, symmetric, frontocentral beta range activity was present.  SLEEP: N1 Sleep (Stage 1) was observed and characterized by the disappearance of alpha rhythm and the appearance of vertex activity.  N2 Sleep (Stage 2) was observed and characterized by vertex waves, K-complexes, and sleep spindles.  N3 (Stage 3) sleep was observed and characterized by high amplitude Delta activity of 20%.  REM sleep was observed.  EKG: There were no arrhythmias or abnormalities noted during this recording.  Impression: Abnormal EEG due to bitemporal independent  right greater than left epileptiform discharges   Clinical Correlation: This ambulatory EEG is suggestive of increase epileptogenic potential in the bitemporal regions, right greater than left. There were no electrographic seizures noted. No events were captured during the recording.   Brelynn Wheller, MD [facility]

## 2024-04-10 ENCOUNTER — Telehealth: Payer: Self-pay | Admitting: Neurology

## 2024-04-10 ENCOUNTER — Telehealth: Payer: Self-pay | Admitting: *Deleted

## 2024-04-10 DIAGNOSIS — G40A11 Absence epileptic syndrome, intractable, with status epilepticus: Secondary | ICD-10-CM

## 2024-04-10 DIAGNOSIS — Z5181 Encounter for therapeutic drug level monitoring: Secondary | ICD-10-CM

## 2024-04-10 DIAGNOSIS — R569 Unspecified convulsions: Secondary | ICD-10-CM

## 2024-04-10 DIAGNOSIS — R9401 Abnormal electroencephalogram [EEG]: Secondary | ICD-10-CM

## 2024-04-10 MED ORDER — XCOPRI 100 MG PO TABS: 100.0000 mg | ORAL_TABLET | Freq: Every day | ORAL | 0 refills | Status: DC

## 2024-04-10 MED ORDER — XCOPRI 14 X 12.5 MG & 14 X 25 MG PO TBPK: ORAL_TABLET | ORAL | 0 refills | Status: DC

## 2024-04-10 MED ORDER — XCOPRI 50 MG PO TABS: 50.0000 mg | ORAL_TABLET | Freq: Every day | ORAL | 0 refills | Status: DC

## 2024-04-10 NOTE — Telephone Encounter (Signed)
 Done

## 2024-04-10 NOTE — Telephone Encounter (Signed)
 Plan for Mrs Kjersti Dittmer, [birth year]   Continue with Lamotrigine  200 mg twice daily Continue with Vimpat  50 mg twice daily Breviact did not improve mood issues.100 mg bid po.  EEG abnormal- confirmed asymmetry again.  Husband reports absence seizures going on.   We are starting Xcopri once daily at 12.5 mg for 14 days ASAP.  Then 25 mg daily for 14 days,  then 50 mg daily for 14 days,   Then 100 mg daily for 14 days- at that point  we will  obtain a Xcopri level, and all other seizure medication levels on 05-22-2024 ( in EPIC as Future Orders for [facility] to draw)    Depending on blood levels , we may be reducing Lamictal  or Vimpat  dose by 50%    And then advance to goal: Goal is a dose of XCOPRI of 150 mg daily by mouth with or without food, whole or crushed .  We may discontinue Vimapt or Lamictal  if seizure free for 12 weeks.    We need also an EKG ( clearing of short Q T time) , any time between 12-30 and January 30 th. Can be done by Dr Lari?.   Out of work until December 12-31 -25.  Dedra Gores, MD

## 2024-04-10 NOTE — Telephone Encounter (Signed)
 I completed , sent to Dr. Chalice to review.

## 2024-04-10 NOTE — Telephone Encounter (Signed)
 FMLA end date placed  04-15-2024 per Dr. Chalice and signed. File to copy.

## 2024-04-10 NOTE — Telephone Encounter (Signed)
 Pt fmla form faxed 04/10/2024 [phone] [employer]

## 2024-04-11 ENCOUNTER — Telehealth: Payer: Self-pay | Admitting: *Deleted

## 2024-04-11 NOTE — Telephone Encounter (Signed)
 [employer] total retirement plan Disability Eligibility review.  Completed, signed. To medical records.

## 2024-04-11 NOTE — Telephone Encounter (Signed)
 This was sent to Dr. Dawayne about Briviact  will pt remain on this or will she come off.  Unclear in mychart email and pt asking.

## 2024-04-11 NOTE — Telephone Encounter (Signed)
 Pt 7A form faxed to [employer] [phone] on 04/11/2024

## 2024-04-11 NOTE — Addendum Note (Signed)
 Addended by: NEYSA POOL S on: 04/11/2024 08:30 AM   Modules accepted: Orders

## 2024-04-11 NOTE — Telephone Encounter (Signed)
 I called Dr. Lari office and they do EKG on pts just need order.  I placed the order (you just need to cosign).

## 2024-04-12 ENCOUNTER — Other Ambulatory Visit: Payer: Self-pay | Admitting: *Deleted

## 2024-04-12 DIAGNOSIS — R9401 Abnormal electroencephalogram [EEG]: Secondary | ICD-10-CM

## 2024-04-12 DIAGNOSIS — G40A11 Absence epileptic syndrome, intractable, with status epilepticus: Secondary | ICD-10-CM

## 2024-04-12 DIAGNOSIS — R569 Unspecified convulsions: Secondary | ICD-10-CM

## 2024-04-12 DIAGNOSIS — Z5181 Encounter for therapeutic drug level monitoring: Secondary | ICD-10-CM

## 2024-04-13 ENCOUNTER — Telehealth: Payer: Self-pay | Admitting: Neurology

## 2024-04-13 NOTE — Telephone Encounter (Signed)
 Patient called after-hours call center regarding her new medication Xcopri .  I was able to talk to her on the phone.  She did not sound in distress.  She reports that she started the medication 3 days ago and developed side effects which have become worse today.  She has an upset stomach and significant heartburn.  She took something for her heartburn.  Since she just started on the low dose 3 days ago, she is advised to hold the medication for now until Dr. Chalice can review this note and weigh in on Monday.  The patient was agreeable to this plan.  I advised her that I would send a note to her neurologist.  She demonstrated understanding and agreement.  Dr. Chalice: Please review and advise patient as to the next steps.

## 2024-04-18 ENCOUNTER — Ambulatory Visit (INDEPENDENT_AMBULATORY_CARE_PROVIDER_SITE_OTHER)

## 2024-04-18 DIAGNOSIS — J309 Allergic rhinitis, unspecified: Secondary | ICD-10-CM | POA: Diagnosis not present

## 2024-04-25 ENCOUNTER — Ambulatory Visit: Payer: Self-pay | Admitting: Neurology

## 2024-04-26 ENCOUNTER — Encounter: Payer: Self-pay | Admitting: *Deleted

## 2024-04-26 NOTE — Telephone Encounter (Signed)
 Form 703 completed and signed.  To Medical records.

## 2024-05-01 MED ORDER — XCOPRI 100 MG PO TABS: 100.0000 mg | ORAL_TABLET | Freq: Every day | ORAL | 0 refills | Status: DC

## 2024-05-01 MED ORDER — XCOPRI 50 MG PO TABS: 50.0000 mg | ORAL_TABLET | Freq: Every day | ORAL | 0 refills | Status: DC

## 2024-05-01 NOTE — Telephone Encounter (Signed)
 Duplicate encounter about this. See other form encounter.

## 2024-05-01 NOTE — Addendum Note (Signed)
 Addended by: CHALICE SAUNAS on: 05/01/2024 12:57 PM   Modules accepted: Orders

## 2024-05-02 ENCOUNTER — Ambulatory Visit (INDEPENDENT_AMBULATORY_CARE_PROVIDER_SITE_OTHER)

## 2024-05-02 DIAGNOSIS — J309 Allergic rhinitis, unspecified: Secondary | ICD-10-CM | POA: Diagnosis not present

## 2024-05-05 ENCOUNTER — Ambulatory Visit: Payer: Self-pay | Admitting: Neurology

## 2024-05-09 ENCOUNTER — Other Ambulatory Visit: Payer: Self-pay | Admitting: Neurology

## 2024-05-09 DIAGNOSIS — G40309 Generalized idiopathic epilepsy and epileptic syndromes, not intractable, without status epilepticus: Secondary | ICD-10-CM

## 2024-05-09 DIAGNOSIS — Z5181 Encounter for therapeutic drug level monitoring: Secondary | ICD-10-CM

## 2024-05-09 NOTE — Progress Notes (Signed)
 XCOPRI  :   25 mg ends today-   Starts on 50 mg daily , will be completed after week 6 and 7 of titration ( 05-23-2024 but its a holiday so should be on it until 05-25-2024 )  Followed by  Multiple Dosages: Starting Thu 05/10/2024, Until Thu 05/24/2024 at 2359, THEN Starting Fri 05/25/2024, Until Fri 06/08/2024 at 2359, THEN Starting Sat 06/09/2024, Until Sat 06/23/2024 at 2359, THEN Starting Sun 06/24/2024, Until Sun 07/08/2024 at 2359 Take 1 tablet (50 mg total) by mouth daily at 6 (six) AM for 15 days, THEN 2 tablets (100 mg total) daily at 6 (six) AM for 15 days, THEN 3 tablets (150 mg total) daily at 6 (six) AM for 15 days, THEN 4 tablets (200 mg total) daily at 6 (six) AM for 15 days., Normal   I spoke to [facility] and the pharmacy of record  has not been able to provide this medication for her unless taken in 30 # packs.   We will use 30 tab packages for increment and changed to 15 day titration interval.

## 2024-05-10 ENCOUNTER — Other Ambulatory Visit: Payer: Self-pay | Admitting: *Deleted

## 2024-05-10 DIAGNOSIS — G251 Drug-induced tremor: Secondary | ICD-10-CM

## 2024-05-10 DIAGNOSIS — G40309 Generalized idiopathic epilepsy and epileptic syndromes, not intractable, without status epilepticus: Secondary | ICD-10-CM

## 2024-05-10 DIAGNOSIS — G40A11 Absence epileptic syndrome, intractable, with status epilepticus: Secondary | ICD-10-CM

## 2024-05-10 DIAGNOSIS — Z5181 Encounter for therapeutic drug level monitoring: Secondary | ICD-10-CM

## 2024-05-10 DIAGNOSIS — R413 Other amnesia: Secondary | ICD-10-CM

## 2024-05-10 LAB — CENOBAMATE (XCOPRI), PLASMA: Cenobamate: 1.6 ug/mL — AB

## 2024-05-10 LAB — LACOSAMIDE: Lacosamide: 3.1 ug/mL — ABNORMAL LOW (ref 5.0–10.0)

## 2024-05-10 LAB — LAMOTRIGINE LEVEL: Lamotrigine Lvl: 7.7 ug/mL (ref 2.0–20.0)

## 2024-05-10 NOTE — Progress Notes (Addendum)
 Spoke to Pharmacist at [facility].  She said she has spoken to musician and also to pt and the pt will need to get from [facility]. I called [facility] they did have some in stock, but will have to order more from warehouse. I called pt and she said that she has taken Xcopri  12.5mg  po daily for 2wks,  then 25mg  po daily for 2 wks, then will to start 50mg .  Needs new prescription.  Sent in by Dr. Chalice yesterday to [facility] but they are not able to handle, to send to [facility]. Will call pt once done.  I LMVM for pt that her medication prescribed to [facility].  And her lab request per Dr. Chalice ok first week of January.  Released to [facility] so she can go to one closer to her.  She is to call back if questions.

## 2024-06-04 ENCOUNTER — Telehealth: Payer: Self-pay | Admitting: *Deleted

## 2024-06-04 NOTE — Telephone Encounter (Signed)
 Received fax from Pcp.  ECG results to us . NSR 05-25-2024 for your review.

## 2024-06-06 ENCOUNTER — Ambulatory Visit (INDEPENDENT_AMBULATORY_CARE_PROVIDER_SITE_OTHER)

## 2024-06-06 DIAGNOSIS — J302 Other seasonal allergic rhinitis: Secondary | ICD-10-CM | POA: Diagnosis not present

## 2024-06-06 NOTE — Telephone Encounter (Signed)
 Please contact pt to schedule visit. Can be in office of virtual with Dr Chalice.

## 2024-06-15 ENCOUNTER — Ambulatory Visit (INDEPENDENT_AMBULATORY_CARE_PROVIDER_SITE_OTHER)

## 2024-06-15 DIAGNOSIS — J302 Other seasonal allergic rhinitis: Secondary | ICD-10-CM | POA: Diagnosis not present

## 2024-06-19 ENCOUNTER — Telehealth: Payer: Self-pay | Admitting: Neurology

## 2024-06-19 NOTE — Telephone Encounter (Signed)
 Pt would not be able to make it on  06/25/2024   , and wanted to know was the appt needed and if  so Can she get another appt before July the patient has yearly appt In July

## 2024-06-20 ENCOUNTER — Ambulatory Visit

## 2024-06-20 DIAGNOSIS — J302 Other seasonal allergic rhinitis: Secondary | ICD-10-CM | POA: Diagnosis not present

## 2024-06-20 NOTE — Telephone Encounter (Signed)
 Phone rep called pt , after checking DPR left a detailed message with response from CMA.

## 2024-06-20 NOTE — Telephone Encounter (Signed)
 Please advise Pt had FU in October, July visit is fine. That will be little over 6 months since last visit.

## 2024-06-26 ENCOUNTER — Ambulatory Visit: Admitting: Neurology

## 2024-06-29 ENCOUNTER — Ambulatory Visit (INDEPENDENT_AMBULATORY_CARE_PROVIDER_SITE_OTHER)

## 2024-06-29 DIAGNOSIS — J302 Other seasonal allergic rhinitis: Secondary | ICD-10-CM

## 2024-11-02 ENCOUNTER — Ambulatory Visit: Admitting: Allergy & Immunology

## 2024-12-20 ENCOUNTER — Ambulatory Visit: Admitting: Neurology
